# Patient Record
Sex: Female | Born: 1985 | Race: Black or African American | Hispanic: No | Marital: Married | Smoking: Never smoker
Health system: Southern US, Community
[De-identification: ages and names within clinical notes are randomized; demographics above are authoritative.]

## PROBLEM LIST (undated history)

## (undated) DIAGNOSIS — Z9889 Other specified postprocedural states: Secondary | ICD-10-CM

## (undated) DIAGNOSIS — R51 Headache: Secondary | ICD-10-CM

## (undated) DIAGNOSIS — R519 Headache, unspecified: Secondary | ICD-10-CM

## (undated) HISTORY — DX: Headache, unspecified: R51.9

## (undated) HISTORY — DX: Headache: R51

---

## 2015-07-19 ENCOUNTER — Encounter (HOSPITAL_COMMUNITY): Payer: Self-pay | Admitting: Emergency Medicine

## 2015-07-19 ENCOUNTER — Emergency Department (HOSPITAL_COMMUNITY): Payer: No Typology Code available for payment source

## 2015-07-19 ENCOUNTER — Emergency Department (HOSPITAL_COMMUNITY)
Admission: EM | Admit: 2015-07-19 | Discharge: 2015-07-19 | Disposition: A | Discharge status: Home / Self Care | Payer: No Typology Code available for payment source | Attending: Emergency Medicine | Admitting: Emergency Medicine

## 2015-07-19 DIAGNOSIS — M549 Dorsalgia, unspecified: Secondary | ICD-10-CM | POA: Diagnosis not present

## 2015-07-19 DIAGNOSIS — Z3202 Encounter for pregnancy test, result negative: Secondary | ICD-10-CM | POA: Diagnosis not present

## 2015-07-19 DIAGNOSIS — R202 Paresthesia of skin: Secondary | ICD-10-CM | POA: Diagnosis not present

## 2015-07-19 DIAGNOSIS — R200 Anesthesia of skin: Secondary | ICD-10-CM

## 2015-07-19 LAB — CBC
HEMATOCRIT: 41.7 % (ref 36.0–46.0)
HEMOGLOBIN: 13.6 g/dL (ref 12.0–15.0)
MCH: 26.8 pg (ref 26.0–34.0)
MCHC: 32.6 g/dL (ref 30.0–36.0)
MCV: 82.2 fL (ref 78.0–100.0)
Platelets: 294 10*3/uL (ref 150–400)
RBC: 5.07 MIL/uL (ref 3.87–5.11)
RDW: 13.7 % (ref 11.5–15.5)
WBC: 7.5 10*3/uL (ref 4.0–10.5)

## 2015-07-19 LAB — LIPASE, BLOOD: Lipase: 29 U/L (ref 11–51)

## 2015-07-19 LAB — COMPREHENSIVE METABOLIC PANEL
ALBUMIN: 4 g/dL (ref 3.5–5.0)
ALK PHOS: 63 U/L (ref 38–126)
ALT: 12 U/L — ABNORMAL LOW (ref 14–54)
ANION GAP: 11 (ref 5–15)
AST: 14 U/L — AB (ref 15–41)
BUN: 8 mg/dL (ref 6–20)
CO2: 23 mmol/L (ref 22–32)
Calcium: 9.5 mg/dL (ref 8.9–10.3)
Chloride: 105 mmol/L (ref 101–111)
Creatinine, Ser: 0.77 mg/dL (ref 0.44–1.00)
GFR calc Af Amer: >60 mL/min (ref >60)
GFR calc non Af Amer: >60 mL/min (ref >60)
GLUCOSE: 107 mg/dL — AB (ref 65–99)
POTASSIUM: 3.6 mmol/L (ref 3.5–5.1)
SODIUM: 139 mmol/L (ref 135–145)
Total Bilirubin: 0.4 mg/dL (ref 0.3–1.2)
Total Protein: 8 g/dL (ref 6.5–8.1)

## 2015-07-19 LAB — URINALYSIS, ROUTINE W REFLEX MICROSCOPIC
BILIRUBIN URINE: NEGATIVE
GLUCOSE, UA: NEGATIVE mg/dL
HGB URINE DIPSTICK: NEGATIVE
Ketones, ur: NEGATIVE mg/dL
Leukocytes, UA: NEGATIVE
Nitrite: NEGATIVE
PH: 7 (ref 5.0–8.0)
Protein, ur: NEGATIVE mg/dL
SPECIFIC GRAVITY, URINE: 1.007 (ref 1.005–1.030)

## 2015-07-19 LAB — I-STAT BETA HCG BLOOD, ED (MC, WL, AP ONLY): I-stat hCG, quantitative: <5 m[IU]/mL (ref <5)

## 2015-07-19 MED ORDER — LORAZEPAM 2 MG/ML IJ SOLN
1.0000 mg | Freq: Once | INTRAMUSCULAR | Status: AC
  Administered 2015-07-19: 1 mg via INTRAMUSCULAR
  Filled 2015-07-19: qty 1

## 2015-07-19 MED ORDER — SODIUM CHLORIDE 0.9 % IV SOLN: INTRAVENOUS | Status: DC

## 2015-07-19 NOTE — ED Notes (Signed)
Pt states she was claustrophobic and could not complete MRI scan. They attempted three times. Pt brought back to room.

## 2015-07-19 NOTE — ED Notes (Signed)
Pt sts left arm numbness and bilateral leg numbness starting while at work today; no obvious neuro deficits noted at present

## 2015-07-19 NOTE — Discharge Instructions (Signed)
Paresthesia Paresthesia is an abnormal burning or prickling sensation. This sensation is generally felt in the hands, arms, legs, or feet. However, it may occur in any part of the body. Usually, it is not painful. The feeling may be described as:  Tingling or numbness.  Pins and needles.  Skin crawling.  Buzzing.  Limbs falling asleep.  Itching. Most people experience temporary (transient) paresthesia at some time in their lives. Paresthesia may occur when you breathe too quickly (hyperventilation). It can also occur without any apparent cause. Commonly, paresthesia occurs when pressure is placed on a nerve. The sensation quickly goes away after the pressure is removed. For some people, however, paresthesia is a long-lasting (chronic) condition that is caused by an underlying disorder. If you continue to have paresthesia, you may need further medical evaluation. HOME CARE INSTRUCTIONS Watch your condition for any changes. Taking the following actions may help to lessen any discomfort that you are feeling:  Avoid drinking alcohol.  Try acupuncture or massage to help relieve your symptoms.  Keep all follow-up visits as directed by your health care provider. This is important. SEEK MEDICAL CARE IF:  You continue to have episodes of paresthesia.  Your burning or prickling feeling gets worse when you walk.  You have pain, cramps, or dizziness.  You develop a rash. SEEK IMMEDIATE MEDICAL CARE IF:  You feel weak.  You have trouble walking or moving.  You have problems with speech, understanding, or vision.  You feel confused.  You cannot control your bladder or bowel movements.  You have numbness after an injury.  You faint.   This information is not intended to replace advice given to you by your health care provider. Make sure you discuss any questions you have with your health care provider.   Document Released: 07/05/2002 Document Revised: 11/29/2014 Document Reviewed:  07/11/2014 Elsevier Interactive Patient Education 2016 [employer] Inc.  

## 2015-07-19 NOTE — ED Notes (Signed)
Pt A&OX4, ambulatory at d/c with steady gait, NAD 

## 2015-07-19 NOTE — ED Provider Notes (Signed)
CSN: [id number]     Arrival date & time 07/19/15  1656 History   First MD Initiated Contact with Patient 07/19/15 1729     Chief Complaint  Patient presents with  . Numbness     (Consider location/radiation/quality/duration/timing/severity/associated sxs/prior Treatment) HPI Comments: Patient here with acute onset of left arm numbness and paresthesias which began today. Her symptoms have been intermittent. There was no associated headache or blurred vision. No neck pain. States that there was no change in her speech. There is no associated confusion. States that the numbness that went to her left leg and spread to her right leg. Symptoms have been without associated back pain. No prior history of same. Nothing makes them better or worse and no treatment used prior to arrival.  The history is provided by the patient.    History reviewed. No pertinent past medical history. History reviewed. No pertinent past surgical history. History reviewed. No pertinent family history. Social History  Substance Use Topics  . Smoking status: Never Smoker   . Smokeless tobacco: None  . Alcohol Use: Yes     Comment: occ   OB History    No data available     Review of Systems  All other systems reviewed and are negative.     Allergies  Review of patient's allergies indicates no known allergies.  Home Medications   Prior to Admission medications   Medication Sig Start Date End Date Taking? Authorizing Provider  etonogestrel (NEXPLANON) 68 MG IMPL implant 1 each by Subdermal route once.   Yes Historical Provider, MD   BP 104/88 mmHg  Pulse 90  Temp(Src) 98.3 F (36.8 C) (Oral)  Resp 18  SpO2 100%  LMP 07/03/2015 Physical Exam  Constitutional: She is oriented to person, place, and time. She appears well-developed and well-nourished.  Non-toxic appearance. No distress.  HENT:  Head: Normocephalic and atraumatic.  Eyes: Conjunctivae, EOM and lids are normal. Pupils are equal, round,  and reactive to light.  Neck: Normal range of motion. Neck supple. No tracheal deviation present. No thyroid mass present.  Cardiovascular: Normal rate, regular rhythm and normal heart sounds.  Exam reveals no gallop.   No murmur heard. Pulmonary/Chest: Effort normal and breath sounds normal. No stridor. No respiratory distress. She has no decreased breath sounds. She has no wheezes. She has no rhonchi. She has no rales.  Abdominal: Soft. Normal appearance and bowel sounds are normal. She exhibits no distension. There is no tenderness. There is no rebound and no CVA tenderness.  Musculoskeletal: Normal range of motion. She exhibits no edema or tenderness.  Neurological: She is alert and oriented to person, place, and time. She has normal strength. No cranial nerve deficit or sensory deficit. GCS eye subscore is 4. GCS verbal subscore is 5. GCS motor subscore is 6.  Skin: Skin is warm and dry. No abrasion and no rash noted.  Psychiatric: She has a normal mood and affect. Her speech is normal and behavior is normal.  Nursing note and vitals reviewed.   ED Course  Procedures (including critical care time) Labs Review Labs Reviewed  COMPREHENSIVE METABOLIC PANEL - Abnormal; Notable for the following:    Glucose, Bld 107 (*)    AST 14 (*)    ALT 12 (*)    All other components within normal limits  LIPASE, BLOOD  CBC  URINALYSIS, ROUTINE W REFLEX MICROSCOPIC (NOT AT [facility])  I-STAT BETA HCG BLOOD, ED ([facility], [facility], [facility] ONLY)  I-STAT BETA HCG BLOOD, ED (  [facility], [facility], [facility] ONLY)    Imaging Review Mr Brain Wo Contrast  07/19/2015  CLINICAL DATA:  Left arm numbness. Bilateral leg numbness. Symptoms began today at work. EXAM: MRI HEAD WITHOUT CONTRAST TECHNIQUE: Multiplanar, multiecho pulse sequences of the brain and surrounding structures were obtained without intravenous contrast. COMPARISON:  None. FINDINGS: The diffusion-weighted images demonstrate no evidence for acute or subacute infarction. No acute  hemorrhage or mass lesion is present. There is no significant white matter disease. The ventricles are of normal size. No significant extra-axial fluid is present. The internal auditory canals are within normal limits. Flow is present in the major intracranial arteries. The globes and orbits are intact. Skullbase is within normal limits. Midline sagittal images are unremarkable. IMPRESSION: 1. Negative MRI of the brain. Electronically Signed   By: Marin Roberts M.D.   On: 07/19/2015 20:48   I have personally reviewed and evaluated these images and lab results as part of my medical decision-making.   EKG Interpretation   Date/Time:  Wednesday July 19 2015 18:00:27 EST Ventricular Rate:  93 PR Interval:  168 QRS Duration: 83 QT Interval:  341 QTC Calculation: 424 R Axis:   61 Text Interpretation:  Sinus rhythm Confirmed by Freida Busman  MD, Ariely Riddell ([id number])  on 07/19/2015 6:07:25 PM      MDM   Final diagnoses:  Numbness    Patient's neurological exam at time of discharge remains stable. Workup including brain MRI negative. Will be given referral to neurology    Lorre Nick, MD 07/19/15 2133

## 2015-08-02 ENCOUNTER — Encounter: Payer: Self-pay | Admitting: Neurology

## 2015-08-02 ENCOUNTER — Telehealth: Payer: Self-pay | Admitting: *Deleted

## 2015-08-02 ENCOUNTER — Ambulatory Visit (INDEPENDENT_AMBULATORY_CARE_PROVIDER_SITE_OTHER): Payer: No Typology Code available for payment source | Admitting: Neurology

## 2015-08-02 VITALS — BP 116/74 | HR 84 | Ht 64.0 in | Wt 212.8 lb

## 2015-08-02 DIAGNOSIS — R202 Paresthesia of skin: Secondary | ICD-10-CM

## 2015-08-02 DIAGNOSIS — G43909 Migraine, unspecified, not intractable, without status migrainosus: Secondary | ICD-10-CM

## 2015-08-02 DIAGNOSIS — G43009 Migraine without aura, not intractable, without status migrainosus: Secondary | ICD-10-CM | POA: Diagnosis not present

## 2015-08-02 DIAGNOSIS — R200 Anesthesia of skin: Secondary | ICD-10-CM

## 2015-08-02 DIAGNOSIS — G43109 Migraine with aura, not intractable, without status migrainosus: Secondary | ICD-10-CM

## 2015-08-02 DIAGNOSIS — G459 Transient cerebral ischemic attack, unspecified: Secondary | ICD-10-CM

## 2015-08-02 MED ORDER — TOPIRAMATE 50 MG PO TABS: 50.0000 mg | ORAL_TABLET | Freq: Every day | ORAL | Status: DC

## 2015-08-02 NOTE — Telephone Encounter (Signed)
Pt [facility] and [facility](office administrator) c/o CP. Called pt back and she stated she has had the CP since she left office. She has had this happen before in the hospital and they performed EKG which came back normal. Asked if she has anxiety and she stated they just started ativan last week for anxiety. She just took a pill. Advised if this does not help, and CP does not improve/gets worse, she needs to proceed to ER to be evaluated. She verbalized understanding.

## 2015-08-02 NOTE — Patient Instructions (Signed)
Overall you are doing fairly well but I do want to suggest a few things today:   Remember to drink plenty of fluid, eat healthy meals and do not skip any meals. Try to eat protein with a every meal and eat a healthy snack such as fruit or nuts in between meals. Try to keep a regular sleep-wake schedule and try to exercise daily, particularly in the form of walking, 20-30 minutes a day, if you can.   As far as your medications are concerned, I would like to suggest: Topiramate 25mg (1/2 pill) for one week then increase to a whole pill at night. Stop daily analgesics.   As far as diagnostic testing: Labs and EEG  I would like to see you back in 3 months, sooner if we need to. Please call us with any interim questions, concerns, problems, updates or refill requests.   Our phone number is [phone]. We also have an after hours call service for urgent matters and there is a physician on-call for urgent questions. For any emergencies you know to call 911 or go to the nearest emergency room

## 2015-08-02 NOTE — Progress Notes (Signed)
[facility]    Provider:  Dr Lucia GaskinsAhern Referring Provider: No ref. provider found Primary Care Physician:  No primary care provider on file.  CC:  Left arm and left leg numbness  HPI:  Abigail Briggs is a 30 y.o. female here as a referral from the ED for episodic left arm and leg numbness. PMHx includes migraines and obesity. She was standing up at work. 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 The left arm became numb and then her leg started getting numb and she felt dizzy like she was going to pass out. No inciting events. She felt like the room was spinning, felt like she was going to drop. It lasted for 30-45 minutes. They called the paramedics and it was still numb and then worsened, she couldn't breathe, she was shaking, she was scared. Symptoms started to calm down again when she got to the ED. She has a history of migraines. She has never had this happen before. She had no weakness. She was able to walk to the car. It was her whole arm and the bottom half of her leg that were numb. The symptoms resolved but they are still happening intermittently. She is feeling it on a daily basis. She is having headaches with the symptoms now. She did feel pulsating on the right side of the head when she was in the hospital and in the back right side of the head with the first episode. Now she has a headache on the back of the right side of the head. No light sensitivity or sound sensitivity. She feels her heart fluttering sometimes. No diabetes, never smoked. Denies vision changes, aphasia, dysphagia, facial droop. She has headaches every day, she is sensitive to noise, she tries to close her eyes and lay down with the headaches, pounding and throbbing unilateral. Her whole head can get to throbbing. She is under a lot of stress. No other focal neurologic symptoms.   Reviewed notes, labs and imaging from outside physicians, which showed: MRi of the brain 07/19/2015: personally reviewed  images:and agree with the following: The diffusion-weighted images demonstrate no evidence for acute or subacute infarction. No acute hemorrhage or mass lesion is present. There is no significant white matter disease. The ventricles are of normal size. No significant extra-axial fluid is present.  The internal auditory canals are within normal limits. Flow is present in the major intracranial arteries. The globes and orbits are intact.  Skullbase is within normal limits. Midline sagittal images are unremarkable.  IMPRESSION: 1. Negative MRI of the brain.  CBC, CMP unremarkable  Patient was seen in the ED on 12/21. acute onset of left arm numbness and paresthesias. EKG was unremarkable, exam was normal, MRI of the brain was negative. She was discharged with neurology follow up.  Review of Systems: Patient complains of symptoms per HPI as well as the following symptoms: no CP, no SOB. Pertinent negatives per HPI. All others negative.   Social History   Social History  . Marital Status: Married    Spouse Name: N/A  . Number of Children: 5  . Years of Education: 14   Occupational History  . [employer]     Social History Main Topics  . Smoking status: Never Smoker   . Smokeless tobacco: Not on file  . Alcohol Use: Yes     Comment: occ  . Drug Use: No  . Sexual Activity: Not on file   Other Topics Concern  . Not on  file   Social History Narrative   Lives with husband and children   Caffeine use: 32 oz per day    Family History  Problem Relation Age of Onset  . Migraines Neg Hx   . Stroke Neg Hx     Past Medical History  Diagnosis Date  . Headache     Past Surgical History  Procedure Laterality Date  . Cesarean section  2014    Current Outpatient Prescriptions  Medication Sig Dispense Refill  . LORazepam (ATIVAN) 1 MG tablet Take 0.5 mg by mouth.    . methylPREDNISolone (MEDROL DOSEPAK) 4 MG TBPK tablet follow package directions    . etonogestrel  (NEXPLANON) 68 MG IMPL implant 1 each by Subdermal route once.    . topiramate (TOPAMAX) 50 MG tablet Take 1 tablet (50 mg total) by mouth at bedtime. 30 tablet 11   No current facility-administered medications for this visit.    Allergies as of 08/02/2015  . (No Known Allergies)    Vitals: BP 116/74 mmHg  Pulse 84  Ht 5\' 4"  (1.626 m)  Wt 212 lb 12.8 oz (96.525 kg)  BMI 36.51 kg/m2  LMP 07/03/2015 Last Weight:  Wt Readings from Last 1 Encounters:  08/02/15 212 lb 12.8 oz (96.525 kg)   Last Height:   Ht Readings from Last 1 Encounters:  08/02/15 5\' 4"  (1.626 m)   Physical exam: Exam: Gen: NAD, conversant, well nourised, obese, well groomed                     CV: RRR, no MRG. No Carotid Bruits. No peripheral edema, warm, nontender Eyes: Conjunctivae clear without exudates or hemorrhage  Neuro: Detailed Neurologic Exam  Speech:    Speech is normal; fluent and spontaneous with normal comprehension.  Cognition:    The patient is oriented to person, place, and time;     recent and remote memory intact;     language fluent;     normal attention, concentration,     fund of knowledge Cranial Nerves:    The pupils are equal, round, and reactive to light. The fundi are normal and spontaneous venous pulsations are present. Visual fields are full to finger confrontation. Extraocular movements are intact. Trigeminal sensation is intact and the muscles of mastication are normal. The face is symmetric. The palate elevates in the midline. Hearing intact. Voice is normal. Shoulder shrug is normal. The tongue has normal motion without fasciculations.   Coordination:    Normal finger to nose and heel to shin. Normal rapid alternating movements.   Gait:    Heel-toe and tandem gait are normal.   Motor Observation:    No asymmetry, no atrophy, and no involuntary movements noted. Tone:    Normal muscle tone.    Posture:    Posture is normal. normal erect    Strength:    Strength  is V/V in the upper and lower limbs.      Sensation: intact to LT     Reflex Exam:  DTR's:    Deep tendon reflexes in the upper and lower extremities are normal bilaterally.   Toes:    The toes are downgoing bilaterally.   Clonus:    Clonus is absent.       Assessment/Plan:  30 year old obese female with acute onset left arm and leg numbness, with repeated episodes. PMHx includes migraines and obesity. Differential includes sensoty seizure vs complicated migraine or panic attack, less likely TIA  Episodes  of left-sided numbness: EEG, will check for hypercoagulable disorders Migraines: Start Topamax.  If unrevealing, can order an emg/ncs. Thanks.  To prevent or relieve headaches, try the following: Cool Compress. Lie down and place a cool compress on your head.  Avoid headache triggers. If certain foods or odors seem to have triggered your migraines in the past, avoid them. A headache diary might help you identify triggers.  Include physical activity in your daily routine. Try a daily walk or other moderate aerobic exercise.  Manage stress. Find healthy ways to cope with the stressors, such as delegating tasks on your to-do list.  Practice relaxation techniques. Try deep breathing, yoga, massage and visualization.  Eat regularly. Eating regularly scheduled meals and maintaining a healthy diet might help prevent headaches. Also, drink plenty of fluids.  Follow a regular sleep schedule. Sleep deprivation might contribute to headaches Consider biofeedback. With this mind-body technique, you learn to control certain bodily functions - such as muscle tension, heart rate and blood pressure - to prevent headaches or reduce headache pain.    Proceed to emergency room if you experience new or worsening symptoms or symptoms do not resolve, if you have new neurologic symptoms or if headache is severe, or for any concerning symptom.    Discussed side effects of Topiramate. Serious side  effects can inclue: Abdominal or stomach pain  fever, chills, or sore throat  lessening of sensations or perception  loss of appetite  mood or mental changes, including aggression, agitation, apathy, irritability, and mental depression  red, irritated, or bleeding gums  weight loss Rare  Blood in the urine  decrease in sexual performance or desire  difficult or painful urination  frequent urination  hearing loss  loss of bladder control  lower back or side pain  nosebleeds  pale skin  red or irritated eyes  ringing or buzzing in the ears  skin rash or itching  swelling  trouble breathing Common side effects of Topamax include:  tiredness,  drowsiness,  dizziness,  nervousness,  numbness or tingly feeling,  coordination problems,  diarrhea,  weight loss,  speech/language problems,  changes in vision,  sensory distortion,  loss of appetite,  bad taste in your mouth,  confusion,  slowed thinking,  trouble concentrating or paying attention,  memory problems,       Naomie Dean, MD  [facility] [address] [place], [place] [address]  Phone [phone] Fax [phone]

## 2015-08-04 NOTE — Telephone Encounter (Signed)
I spoke to pt.  She had this reaction after 2nd dose of topamax for headaches.  EMT checked her out and they thought topamax.  I told her to stop the topamax and be evaluated by pcp, which she stated has appt today.  She will let us know how she is on Monday and then go from there.   I would let Dr. Lucia GaskinsAhern know.   She verbalized understanding.

## 2015-08-04 NOTE — Telephone Encounter (Signed)
Patient called to advise, "this morning heart took like a long flutter, chest got warm and hot like she could barely breathe, heart started racing really fast, patient called ambulance, EMT said it may be a side effect of topiramate (TOPAMAX) 50 MG tablet.".

## 2015-08-07 ENCOUNTER — Ambulatory Visit: Payer: No Typology Code available for payment source | Admitting: Neurology

## 2015-08-07 NOTE — Telephone Encounter (Signed)
Sounds like an uncommon side effects for topamax. We can try a different medication if she likes, thanks

## 2015-08-08 ENCOUNTER — Other Ambulatory Visit: Payer: Self-pay | Admitting: Neurology

## 2015-08-08 MED ORDER — NORTRIPTYLINE HCL 10 MG PO CAPS: 10.0000 mg | ORAL_CAPSULE | Freq: Every day | ORAL | Status: DC

## 2015-08-08 NOTE — Telephone Encounter (Signed)
Called pt back. She stated she is off of topamax. She would like to try a different medication. She was told by EMT/PCP this was most likely topamax. Advised per Dr Lucia GaskinsAhern this is an uncommon SE of medication. She is happy to try a different medication. Advised I will call her back to advise once I speak to Dr Lucia GaskinsAhern. She verbalized understanding. She went to PCP and she referred her to cardiology to further evaluate heart palpitations.

## 2015-08-08 NOTE — Telephone Encounter (Signed)
Spoke with patient, we can start Nortriptyline. She takes it at night before bed. It may make her tired. side effects include: teratogenicity, do not Pregnant on this medication and use birth control. Discussed side effects. Reviewed EKG, QTc wnl. Serious side effects can include hypotension, hypertension, syncope, ventricular arrhythmias, QT prolongation and other cardiac side effects, stroke and seizures, ataxia tardive dyskinesias, extrapyramidal symptoms, increased intraocular pressure, leukopenia, thrombocytopenia, hallucinations, suicidality and other serious side effects. Common reactions include drowsiness, dry mouth, dizziness, constipation, blurred vision, palpitations, tachycardia, impaired coordination, increased appetite, nausea vomiting, weakness, confusion, disorientation, restlessness, anxiety and other side effects.

## 2015-08-10 ENCOUNTER — Encounter: Payer: Self-pay | Admitting: Neurology

## 2015-08-10 ENCOUNTER — Telehealth: Payer: Self-pay | Admitting: Neurology

## 2015-08-10 LAB — HYPERCOAGULABLE PANEL, COMPREHENSIVE
ANTICARDIOLIPIN AB, IGM: 19 [MPL'U]
APTT: 24.3 s
AT III Act/Nor PPP Chro: 126 %
Act. Prt C Resist w/FV Defic.: 2.6 ratio
Beta-2 Glycoprotein I, IgA: <10 SAU
DRVVT Screen Seconds: 44 s
FACTOR VII ANTIGEN: 73 %
FACTOR VIII ACTIVITY: 164 % — AB
HEXAGONAL PHOSPHOLIPID NEUTRAL: 1 s
HOMOCYSTEINE: 13 umol/L
PROT C AG ACT/NOR PPP IMM: 102 %
PROT S AG ACT/NOR PPP IMM: 106 %
Protein C Ag/FVII Ag Ratio**: 1.4 ratio
Protein S Ag/FVII Ag Ratio**: 1.5 ratio

## 2015-08-10 LAB — HEMOGLOBINOPATHY EVALUATION
HEMOGLOBIN A2 QUANTITATION: 2.3 % (ref 0.7–3.1)
HEMOGLOBIN F QUANTITATION: 0 % (ref 0.0–2.0)
HGB C: 0 %
HGB S: 0 %
Hgb A: 97.7 % (ref 94.0–98.0)

## 2015-08-10 NOTE — Telephone Encounter (Signed)
Abigail Briggs, would you call patient and see what question she has about her medication? She was on trokendi and had a reaction then I switched her to Nortriptyline. She says she has a question. thanks

## 2015-08-11 NOTE — Telephone Encounter (Signed)
Called pt. Advised Dr Lucia GaskinsAhern cannot do short-term disability as requested. She needs to go to PCP. She verbalized understanding.

## 2015-08-11 NOTE — Telephone Encounter (Signed)
I won't do short-term disability, her primary care needs to thanks

## 2015-08-11 NOTE — Telephone Encounter (Signed)
Called pt. She wanted to know if she should take nortriptyline every night. I advised per Dr Lucia GaskinsAhern instructions that she should take medication every night. She also wanted to know if Dr Lucia GaskinsAhern will do short-term leave for her from work (2-3 weeks). Advised I will have to speak to Dr Lucia GaskinsAhern first and I will call her back to let her know later on today. She verbalized understanding.

## 2015-08-30 ENCOUNTER — Ambulatory Visit (INDEPENDENT_AMBULATORY_CARE_PROVIDER_SITE_OTHER): Payer: No Typology Code available for payment source | Admitting: Neurology

## 2015-08-30 DIAGNOSIS — G459 Transient cerebral ischemic attack, unspecified: Secondary | ICD-10-CM

## 2015-08-30 DIAGNOSIS — R55 Syncope and collapse: Secondary | ICD-10-CM | POA: Diagnosis not present

## 2015-08-30 DIAGNOSIS — R202 Paresthesia of skin: Principal | ICD-10-CM

## 2015-08-30 DIAGNOSIS — R200 Anesthesia of skin: Secondary | ICD-10-CM

## 2015-08-30 DIAGNOSIS — G43109 Migraine with aura, not intractable, without status migrainosus: Secondary | ICD-10-CM

## 2015-08-30 DIAGNOSIS — G43009 Migraine without aura, not intractable, without status migrainosus: Secondary | ICD-10-CM

## 2015-08-30 NOTE — Procedures (Signed)
    History:  Abigail Briggs is a 30 year old patient with a history of obesity who had an episode of dizziness and left-sided numbness that began on 07/19/2015. The episode lasted 30-45 minutes. The patient is being evaluated for this event. MRI of the brain was relatively unremarkable.  This is a routine EEG. No skull defects are noted. Medications include Nexplanon and nortriptyline.   EEG classification: Normal awake  Description of the recording: The background rhythms of this recording consists of a fairly well modulated medium amplitude alpha rhythm of 10 Hz that is reactive to eye opening and closure. As the record progresses, the patient appears to remain in the waking state throughout the recording. Photic stimulation was performed, resulting in a bilateral and symmetric photic driving response. Hyperventilation was also performed, resulting in a minimal buildup of the background rhythm activities without significant slowing seen. At no time during the recording does there appear to be evidence of spike or spike wave discharges or evidence of focal slowing. EKG monitor shows no evidence of cardiac rhythm abnormalities with a heart rate of 84.  Impression: This is a normal EEG recording in the waking state. No evidence of ictal or interictal discharges are seen.

## 2015-08-31 ENCOUNTER — Telehealth: Payer: Self-pay | Admitting: *Deleted

## 2015-08-31 NOTE — Telephone Encounter (Signed)
-----   Message from Anson Fret, MD sent at 08/30/2015  5:34 PM EST ----- EEG was negative thanks

## 2015-08-31 NOTE — Telephone Encounter (Signed)
Patient returned Emma's call, advised EEG negative.

## 2015-08-31 NOTE — Telephone Encounter (Addendum)
LVM for pt to call about results. Gave [facility] phone number. Ok to inform pt EEG negative.

## 2015-09-06 ENCOUNTER — Telehealth: Payer: Self-pay | Admitting: Neurology

## 2015-09-06 NOTE — Telephone Encounter (Signed)
Per Dr Lucia Gaskins- okay to schedule f/u w/ pt.

## 2015-09-06 NOTE — Telephone Encounter (Signed)
Pt called and said that she was seen in the ER in [place] for her problems with her head and needs to speak to the nurse about getting in to be seen for a f/u. I did not see any notes from the hospital in pt's chart not sure if she was seen at a [place] hospital.

## 2015-09-12 ENCOUNTER — Ambulatory Visit (INDEPENDENT_AMBULATORY_CARE_PROVIDER_SITE_OTHER): Payer: No Typology Code available for payment source | Admitting: Neurology

## 2015-09-12 ENCOUNTER — Encounter: Payer: Self-pay | Admitting: Neurology

## 2015-09-12 VITALS — BP 128/80 | HR 77 | Ht 64.0 in | Wt 215.2 lb

## 2015-09-12 DIAGNOSIS — G43001 Migraine without aura, not intractable, with status migrainosus: Secondary | ICD-10-CM | POA: Diagnosis not present

## 2015-09-12 MED ORDER — SUMATRIPTAN SUCCINATE 100 MG PO TABS: 100.0000 mg | ORAL_TABLET | Freq: Once | ORAL | Status: DC | PRN

## 2015-09-12 MED ORDER — DICLOFENAC POTASSIUM(MIGRAINE) 50 MG PO PACK: 50.0000 mg | PACK | Freq: Once | ORAL | Status: DC | PRN

## 2015-09-12 MED ORDER — ONDANSETRON 4 MG PO TBDP: 4.0000 mg | ORAL_TABLET | Freq: Three times a day (TID) | ORAL | Status: DC | PRN

## 2015-09-12 NOTE — Patient Instructions (Signed)
Remember to drink plenty of fluid, eat healthy meals and do not skip any meals. Try to eat protein with a every meal and eat a healthy snack such as fruit or nuts in between meals. Try to keep a regular sleep-wake schedule and try to exercise daily, particularly in the form of walking, 20-30 minutes a day, if you can.   As far as your medications are concerned, I would like to suggest: Please take one tablet Imitrex and zofran at the onset of your headache. If it does not improve the symptoms please take one additional imitrex tablet tablet in 2 hours. Do not take more then 2 tablets in 24hrs. Do not take use more then 2 to 3 times in a week.Can also try the Cambia once at onset of headache.  Our phone number is [phone]. We also have an after hours call service for urgent matters and there is a physician on-call for urgent questions. For any emergencies you know to call 911 or go to the nearest emergency room

## 2015-09-12 NOTE — Progress Notes (Signed)
[facility]    Provider:  Dr Lucia Gaskins Referring Provider: No ref. provider found Primary Care Physician:  No primary care provider on file.  CC: Migraines  Interval history 09/12/2015:  She was started on Topirmate for migraines but had reaction after one dose. She was started on Nortriptyline instead. Headaches for years. She was at [facility] a week ago for headache and no headaches since. She has had one bad headache this month and a few small ones. This is improved from last visit where she reported daily headaches. Headaches are unilateral, throbbing, pounding with light and sound sensitivity, she tries to lay down, no Family History of migraines. No side effects from the nortriptyline. She was seen in the ED for a severe migraine over 2 days. She has no medication overuse headache, she has only use advil once this month. We will continue the nortriptyline.   HPI: Abigail Briggs is a 30 y.o. female here as a referral from the ED for episodic left arm and leg numbness. PMHx includes migraines and obesity. She was standing up at work. 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 The left arm became numb and then her leg started getting numb and she felt dizzy like she was going to pass out. No inciting events. She felt like the room was spinning, felt like she was going to drop. It lasted for 30-45 minutes. They called the paramedics and it was still numb and then worsened, she couldn't breathe, she was shaking, she was scared. Symptoms started to calm down again when she got to the ED. She has a history of migraines. She has never had this happen before. She had no weakness. She was able to walk to the car. It was her whole arm and the bottom half of her leg that were numb. The symptoms resolved but they are still happening intermittently. She is feeling it on a daily basis. She is having headaches with the symptoms now. She did feel pulsating on the right side of the head when she  was in the hospital and in the back right side of the head with the first episode. Now she has a headache on the back of the right side of the head. No light sensitivity or sound sensitivity. She feels her heart fluttering sometimes. No diabetes, never smoked. Denies vision changes, aphasia, dysphagia, facial droop. She has headaches every day, she is sensitive to noise, she tries to close her eyes and lay down with the headaches, pounding and throbbing unilateral. Her whole head can get to throbbing. She is under a lot of stress. No other focal neurologic symptoms.   Reviewed notes, labs and imaging from outside physicians, which showed: MRi of the brain 07/19/2015: personally reviewed images:and agree with the following: The diffusion-weighted images demonstrate no evidence for acute or subacute infarction. No acute hemorrhage or mass lesion is present. There is no significant white matter disease. The ventricles are of normal size. No significant extra-axial fluid is present.  The internal auditory canals are within normal limits. Flow is present in the major intracranial arteries. The globes and orbits are intact.  Skullbase is within normal limits. Midline sagittal images are unremarkable.  IMPRESSION: 1. Negative MRI of the brain.  CBC, CMP unremarkable  Patient was seen in the ED on 12/21. acute onset of left arm numbness and paresthesias. EKG was unremarkable, exam was normal, MRI of the brain was negative. She was discharged with neurology follow up.  Social History  Social History  . Marital Status: Married    Spouse Name: N/A  . Number of Children: 5  . Years of Education: 14   Occupational History  . [employer]     Social History Main Topics  . Smoking status: Never Smoker   . Smokeless tobacco: Not on file  . Alcohol Use: Yes     Comment: occ  . Drug Use: No  . Sexual Activity: Not on file   Other Topics Concern  . Not on file   Social History Narrative    Lives with husband and children   Caffeine use: 32 oz per day    Family History  Problem Relation Age of Onset  . Migraines Neg Hx   . Stroke Neg Hx     Past Medical History  Diagnosis Date  . Headache     Past Surgical History  Procedure Laterality Date  . Cesarean section  2014    Current Outpatient Prescriptions  Medication Sig Dispense Refill  . etonogestrel (NEXPLANON) 68 MG IMPL implant 1 each by Subdermal route once.    . methylPREDNISolone (MEDROL DOSEPAK) 4 MG TBPK tablet follow package directions    . nortriptyline (PAMELOR) 10 MG capsule Take 1 capsule (10 mg total) by mouth at bedtime. 30 capsule 11   No current facility-administered medications for this visit.    Allergies as of 09/12/2015  . (No Known Allergies)    Vitals: There were no vitals taken for this visit. Last Weight:  Wt Readings from Last 1 Encounters:  08/02/15 212 lb 12.8 oz (96.525 kg)   Last Height:   Ht Readings from Last 1 Encounters:  08/02/15  (1.626 m)    Speech:  Speech is normal; fluent and spontaneous with normal comprehension.  Cognition:  The patient is oriented to person, place, and time;   recent and remote memory intact;   language fluent;   normal attention, concentration,   fund of knowledge Cranial Nerves:  The pupils are equal, round, and reactive to light. The fundi are normal and spontaneous venous pulsations are present. Visual fields are full to finger confrontation. Extraocular movements are intact. Trigeminal sensation is intact and the muscles of mastication are normal. The face is symmetric. The palate elevates in the midline. Hearing intact. Voice is normal. Shoulder shrug is normal. The tongue has normal motion without fasciculations.   Coordination:  Normal finger to nose and heel to shin. Normal rapid alternating movements.   Gait:  Heel-toe and tandem gait are normal.   Motor Observation:  No asymmetry, no atrophy,  and no involuntary movements noted. Tone:  Normal muscle tone.   Posture:  Posture is normal. normal erect   Strength:  Strength is V/V in the upper and lower limbs.    Sensation: intact to LT   Reflex Exam:  DTR's:  Deep tendon reflexes in the upper and lower extremities are normal bilaterally.  Toes:  The toes are downgoing bilaterally.  Clonus:  Clonus is absent.      Assessment/Plan: 30 year old obese female with migraines:  Episodes of left-sided numbness: EEG, will check for hypercoagulable disorders which were negative, MRi was negative 06/2015 Migraines: Topamax with side effects. Doing well on Nortriptyline. Imitrex and zofran for acue management. The most common side-effects are feeling sick (nausea), dizziness and dry mouth. In addition, triptans can also cause some people to experience strange sensations. These may be a tightness, tingling, flushing, and feelings of heaviness or pressure in areas  such as the face and limbs, and occasionally the chest. Serious side effects can include stroke, cardiac side effects such as chest tightness, shortness of breath and possible cardiovascular adverse effects.   Zofran: Common side effects of Zofran include: diarrhea, headache, fever, lightheadedness, dizziness, weakness, tiredness, drowsiness. Call us at once if you have a serious side effect such ZO:XWRUEAV vision or temporary vision loss (lasting from only a few minutes to several hours); severe dizziness, feeling short of breath, fainting, fast or pounding heartbeats; slow heart rate, trouble breathing; anxiety, agitation, shivering; feeling like you might pass out; or urinating less than usual or not at all.  Cambia: take with food. Stop for GI upset or dark stools.     If unrevealing, can order an emg/ncs. Thanks.  To prevent or relieve headaches, try the following:  Cool Compress. Lie down and place a cool compress on your head.   Avoid  headache triggers. If certain foods or odors seem to have triggered your migraines in the past, avoid them. A headache diary might help you identify triggers.   Include physical activity in your daily routine. Try a daily walk or other moderate aerobic exercise.   Manage stress. Find healthy ways to cope with the stressors, such as delegating tasks on your to-do list.   Practice relaxation techniques. Try deep breathing, yoga, massage and visualization.   Eat regularly. Eating regularly scheduled meals and maintaining a healthy diet might help prevent headaches. Also, drink plenty of fluids.   Follow a regular sleep schedule. Sleep deprivation might contribute to headaches  Consider biofeedback. With this mind-body technique, you learn to control certain bodily functions - such as muscle tension, heart rate and blood pressure - to prevent headaches or reduce headache pain.   Proceed to emergency room if you experience new or worsening symptoms or symptoms do not resolve, if you have new neurologic symptoms or if headache is severe, or for any concerning symptom.   Naomie Dean, MD  [facility] [address] [place], [place] [address]  Phone [phone] Fax [phone]  A total of 30 minutes was spent face-to-face with this patient. Over half this time was spent on counseling patient on the migraine diagnosis and different diagnostic and therapeutic options available.

## 2015-10-30 ENCOUNTER — Ambulatory Visit: Payer: No Typology Code available for payment source | Admitting: Neurology

## 2018-09-05 ENCOUNTER — Encounter (HOSPITAL_COMMUNITY)
Admission: EM | Disposition: A | Discharge status: Home / Self Care | Payer: Self-pay | Source: Home / Self Care | Attending: Obstetrics and Gynecology

## 2018-09-05 ENCOUNTER — Emergency Department (HOSPITAL_COMMUNITY): Payer: Medicaid Other | Admitting: Anesthesiology

## 2018-09-05 ENCOUNTER — Inpatient Hospital Stay (HOSPITAL_COMMUNITY)
Admission: EM | Admit: 2018-09-05 | Discharge: 2018-09-09 | DRG: 769 | Disposition: A | Discharge status: Home / Self Care | Payer: Medicaid Other | Attending: Obstetrics and Gynecology | Admitting: Obstetrics and Gynecology

## 2018-09-05 ENCOUNTER — Encounter (HOSPITAL_COMMUNITY): Payer: Self-pay | Admitting: Emergency Medicine

## 2018-09-05 ENCOUNTER — Inpatient Hospital Stay (HOSPITAL_COMMUNITY): Payer: Medicaid Other

## 2018-09-05 DIAGNOSIS — Z9889 Other specified postprocedural states: Secondary | ICD-10-CM

## 2018-09-05 DIAGNOSIS — O8612 Endometritis following delivery: Secondary | ICD-10-CM | POA: Diagnosis present

## 2018-09-05 DIAGNOSIS — D62 Acute posthemorrhagic anemia: Secondary | ICD-10-CM | POA: Diagnosis present

## 2018-09-05 DIAGNOSIS — O9081 Anemia of the puerperium: Secondary | ICD-10-CM | POA: Diagnosis present

## 2018-09-05 DIAGNOSIS — N939 Abnormal uterine and vaginal bleeding, unspecified: Secondary | ICD-10-CM | POA: Diagnosis not present

## 2018-09-05 DIAGNOSIS — Z419 Encounter for procedure for purposes other than remedying health state, unspecified: Secondary | ICD-10-CM

## 2018-09-05 DIAGNOSIS — Z90711 Acquired absence of uterus with remaining cervical stump: Secondary | ICD-10-CM | POA: Diagnosis present

## 2018-09-05 DIAGNOSIS — Z90710 Acquired absence of both cervix and uterus: Secondary | ICD-10-CM

## 2018-09-05 DIAGNOSIS — Z98891 History of uterine scar from previous surgery: Secondary | ICD-10-CM | POA: Diagnosis not present

## 2018-09-05 DIAGNOSIS — J9600 Acute respiratory failure, unspecified whether with hypoxia or hypercapnia: Secondary | ICD-10-CM

## 2018-09-05 DIAGNOSIS — D649 Anemia, unspecified: Secondary | ICD-10-CM | POA: Diagnosis not present

## 2018-09-05 HISTORY — DX: Other specified postprocedural states: Z98.890

## 2018-09-05 HISTORY — PX: ABDOMINAL HYSTERECTOMY: SHX81

## 2018-09-05 HISTORY — PX: CYSTOSCOPY: SHX5120

## 2018-09-05 HISTORY — PX: BILATERAL SALPINGECTOMY: SHX5743

## 2018-09-05 HISTORY — PX: DILATION AND CURETTAGE OF UTERUS: SHX78

## 2018-09-05 HISTORY — PX: SUPRACERVICAL ABDOMINAL HYSTERECTOMY: SHX5393

## 2018-09-05 LAB — COMPREHENSIVE METABOLIC PANEL
ALT: 10 U/L (ref 0–44)
AST: 13 U/L — ABNORMAL LOW (ref 15–41)
Albumin: 3 g/dL — ABNORMAL LOW (ref 3.5–5.0)
Alkaline Phosphatase: 48 U/L (ref 38–126)
Anion gap: 12 (ref 5–15)
BUN: 5 mg/dL — ABNORMAL LOW (ref 6–20)
CO2: 22 mmol/L (ref 22–32)
Calcium: 8.7 mg/dL — ABNORMAL LOW (ref 8.9–10.3)
Chloride: 107 mmol/L (ref 98–111)
Creatinine, Ser: 0.89 mg/dL (ref 0.44–1.00)
GFR calc Af Amer: >60 mL/min (ref >60)
GFR calc non Af Amer: >60 mL/min (ref >60)
Glucose, Bld: 140 mg/dL — ABNORMAL HIGH (ref 70–99)
Potassium: 3.7 mmol/L (ref 3.5–5.1)
Sodium: 141 mmol/L (ref 135–145)
Total Bilirubin: 0.4 mg/dL (ref 0.3–1.2)
Total Protein: 6.3 g/dL — ABNORMAL LOW (ref 6.5–8.1)

## 2018-09-05 LAB — CBC
HCT: 40.9 % (ref 36.0–46.0)
HCT: 35.9 % — ABNORMAL LOW (ref 36.0–46.0)
HEMOGLOBIN: 12.2 g/dL (ref 12.0–15.0)
Hemoglobin: 13.3 g/dL (ref 12.0–15.0)
MCH: 29.2 pg (ref 26.0–34.0)
MCH: 29.8 pg (ref 26.0–34.0)
MCHC: 32.5 g/dL (ref 30.0–36.0)
MCHC: 34 g/dL (ref 30.0–36.0)
MCV: 89.9 fL (ref 80.0–100.0)
MCV: 87.6 fL (ref 80.0–100.0)
PLATELETS: 131 10*3/uL — AB (ref 150–400)
Platelets: 114 10*3/uL — ABNORMAL LOW (ref 150–400)
RBC: 4.55 MIL/uL (ref 3.87–5.11)
RBC: 4.1 MIL/uL (ref 3.87–5.11)
RDW: 14.6 % (ref 11.5–15.5)
RDW: 14.5 % (ref 11.5–15.5)
WBC: 19.6 10*3/uL — ABNORMAL HIGH (ref 4.0–10.5)
WBC: 17.1 10*3/uL — ABNORMAL HIGH (ref 4.0–10.5)
nRBC: 0 % (ref 0.0–0.2)
nRBC: 0 % (ref 0.0–0.2)

## 2018-09-05 LAB — BASIC METABOLIC PANEL
Anion gap: 8 (ref 5–15)
BUN: 8 mg/dL (ref 6–20)
CHLORIDE: 111 mmol/L (ref 98–111)
CO2: 21 mmol/L — ABNORMAL LOW (ref 22–32)
Calcium: 6.8 mg/dL — ABNORMAL LOW (ref 8.9–10.3)
Creatinine, Ser: 0.77 mg/dL (ref 0.44–1.00)
GFR calc non Af Amer: >60 mL/min (ref >60)
Glucose, Bld: 141 mg/dL — ABNORMAL HIGH (ref 70–99)
Potassium: 4.1 mmol/L (ref 3.5–5.1)
SODIUM: 140 mmol/L (ref 135–145)

## 2018-09-05 LAB — BPAM PLATELET PHERESIS
Blood Product Expiration Date: 202002092359
Blood Product Expiration Date: 202002102359
ISSUE DATE / TIME: 202002081204
ISSUE DATE / TIME: 202002081204
Unit Type and Rh: 7300
Unit Type and Rh: 8400

## 2018-09-05 LAB — PREPARE CRYOPRECIPITATE: Unit division: 0

## 2018-09-05 LAB — PROTIME-INR
INR: 1.33
PROTHROMBIN TIME: 16.3 s — AB (ref 11.4–15.2)

## 2018-09-05 LAB — DIC (DISSEMINATED INTRAVASCULAR COAGULATION)PANEL
D-Dimer, Quant: 2.24 ug/mL-FEU — ABNORMAL HIGH (ref 0.00–0.50)
INR: 1.25
Platelets: 456 10*3/uL — ABNORMAL HIGH (ref 150–400)
Prothrombin Time: 15.6 seconds — ABNORMAL HIGH (ref 11.4–15.2)
Smear Review: NONE SEEN
aPTT: 22 seconds — ABNORMAL LOW (ref 24–36)

## 2018-09-05 LAB — POCT I-STAT 7, (LYTES, BLD GAS, ICA,H+H)
Acid-base deficit: 2 mmol/L (ref 0.0–2.0)
Acid-base deficit: 3 mmol/L — ABNORMAL HIGH (ref 0.0–2.0)
Bicarbonate: 23.3 mmol/L (ref 20.0–28.0)
Bicarbonate: 22.9 mmol/L (ref 20.0–28.0)
CALCIUM ION: 0.84 mmol/L — AB (ref 1.15–1.40)
Calcium, Ion: 0.7 mmol/L — CL (ref 1.15–1.40)
HCT: 21 % — ABNORMAL LOW (ref 36.0–46.0)
HCT: 28 % — ABNORMAL LOW (ref 36.0–46.0)
Hemoglobin: 7.1 g/dL — ABNORMAL LOW (ref 12.0–15.0)
Hemoglobin: 9.5 g/dL — ABNORMAL LOW (ref 12.0–15.0)
O2 Saturation: 100 %
O2 Saturation: 100 %
PCO2 ART: 42.8 mmHg (ref 32.0–48.0)
PH ART: 7.372 (ref 7.350–7.450)
POTASSIUM: 3.5 mmol/L (ref 3.5–5.1)
Patient temperature: 35.7
Patient temperature: 35.5
Potassium: 4 mmol/L (ref 3.5–5.1)
Sodium: 142 mmol/L (ref 135–145)
Sodium: 143 mmol/L (ref 135–145)
TCO2: 25 mmol/L (ref 22–32)
TCO2: 24 mmol/L (ref 22–32)
pCO2 arterial: 39.6 mmHg (ref 32.0–48.0)
pH, Arterial: 7.329 — ABNORMAL LOW (ref 7.350–7.450)
pO2, Arterial: 500 mmHg — ABNORMAL HIGH (ref 83.0–108.0)
pO2, Arterial: 462 mmHg — ABNORMAL HIGH (ref 83.0–108.0)

## 2018-09-05 LAB — CREATININE, SERUM: Creatinine, Ser: 0.69 mg/dL (ref 0.44–1.00)

## 2018-09-05 LAB — PREPARE PLATELET PHERESIS
Unit division: 0
Unit division: 0

## 2018-09-05 LAB — CBC WITH DIFFERENTIAL/PLATELET
Abs Immature Granulocytes: 0.02 10*3/uL (ref 0.00–0.07)
Basophils Absolute: 0 10*3/uL (ref 0.0–0.1)
Basophils Relative: 1 %
Eosinophils Absolute: 0.1 10*3/uL (ref 0.0–0.5)
Eosinophils Relative: 1 %
HCT: 31.6 % — ABNORMAL LOW (ref 36.0–46.0)
Hemoglobin: 9.3 g/dL — ABNORMAL LOW (ref 12.0–15.0)
Immature Granulocytes: 0 %
Lymphocytes Relative: 66 %
Lymphs Abs: 4.7 10*3/uL — ABNORMAL HIGH (ref 0.7–4.0)
MCH: 25.9 pg — ABNORMAL LOW (ref 26.0–34.0)
MCHC: 29.4 g/dL — ABNORMAL LOW (ref 30.0–36.0)
MCV: 88 fL (ref 80.0–100.0)
Monocytes Absolute: 0.3 10*3/uL (ref 0.1–1.0)
Monocytes Relative: 5 %
Neutro Abs: 1.9 10*3/uL (ref 1.7–7.7)
Neutrophils Relative %: 27 %
Platelets: 412 10*3/uL — ABNORMAL HIGH (ref 150–400)
RBC: 3.59 MIL/uL — ABNORMAL LOW (ref 3.87–5.11)
RDW: 14.2 % (ref 11.5–15.5)
WBC: 7.2 10*3/uL (ref 4.0–10.5)
nRBC: 0 % (ref 0.0–0.2)

## 2018-09-05 LAB — POCT I-STAT 4, (NA,K, GLUC, HGB,HCT)
Glucose, Bld: 158 mg/dL — ABNORMAL HIGH (ref 70–99)
HEMATOCRIT: 36 % (ref 36.0–46.0)
Hemoglobin: 12.2 g/dL (ref 12.0–15.0)
Potassium: 4 mmol/L (ref 3.5–5.1)
Sodium: 144 mmol/L (ref 135–145)

## 2018-09-05 LAB — BPAM CRYOPRECIPITATE
Blood Product Expiration Date: 202002081750
ISSUE DATE / TIME: 202002081242
Unit Type and Rh: 6200

## 2018-09-05 LAB — GLUCOSE, CAPILLARY: GLUCOSE-CAPILLARY: 106 mg/dL — AB (ref 70–99)

## 2018-09-05 LAB — MAGNESIUM: MAGNESIUM: 1.4 mg/dL — AB (ref 1.7–2.4)

## 2018-09-05 LAB — ABO/RH: ABO/RH(D): AB POS

## 2018-09-05 LAB — MASSIVE TRANSFUSION PROTOCOL ORDER (BLOOD BANK NOTIFICATION)

## 2018-09-05 LAB — DIC (DISSEMINATED INTRAVASCULAR COAGULATION) PANEL (NOT AT ARMC): Fibrinogen: 381 mg/dL (ref 210–475)

## 2018-09-05 SURGERY — EXAM UNDER ANESTHESIA
Anesthesia: General | Site: Uterus

## 2018-09-05 MED ORDER — CEFAZOLIN SODIUM-DEXTROSE 2-3 GM-%(50ML) IV SOLR
INTRAVENOUS | Status: DC | PRN
  Administered 2018-09-05: 2 g via INTRAVENOUS

## 2018-09-05 MED ORDER — SODIUM CHLORIDE 0.9 % IV BOLUS
2000.0000 mL | Freq: Once | INTRAVENOUS | Status: AC
  Administered 2018-09-05: 2000 mL via INTRAVENOUS

## 2018-09-05 MED ORDER — ROCURONIUM BROMIDE 50 MG/5ML IV SOSY
PREFILLED_SYRINGE | INTRAVENOUS | Status: AC
  Filled 2018-09-05: qty 15

## 2018-09-05 MED ORDER — SIMETHICONE 80 MG PO CHEW
80.0000 mg | CHEWABLE_TABLET | Freq: Four times a day (QID) | ORAL | Status: DC | PRN
  Administered 2018-09-06 – 2018-09-09 (×3): 80 mg via ORAL
  Filled 2018-09-05 (×3): qty 1

## 2018-09-05 MED ORDER — MISOPROSTOL 200 MCG PO TABS
1000.0000 ug | ORAL_TABLET | Freq: Once | ORAL | Status: DC
  Filled 2018-09-05: qty 5

## 2018-09-05 MED ORDER — CLINDAMYCIN PHOSPHATE 900 MG/50ML IV SOLN
900.0000 mg | Freq: Three times a day (TID) | INTRAVENOUS | Status: DC
  Administered 2018-09-05 – 2018-09-08 (×8): 900 mg via INTRAVENOUS
  Filled 2018-09-05 (×10): qty 50

## 2018-09-05 MED ORDER — LIDOCAINE 2% (20 MG/ML) 5 ML SYRINGE
INTRAMUSCULAR | Status: AC
  Filled 2018-09-05: qty 5

## 2018-09-05 MED ORDER — ROCURONIUM BROMIDE 100 MG/10ML IV SOLN
INTRAVENOUS | Status: DC | PRN
  Administered 2018-09-05: 50 mg via INTRAVENOUS
  Administered 2018-09-05: 30 mg via INTRAVENOUS
  Administered 2018-09-05: 50 mg via INTRAVENOUS

## 2018-09-05 MED ORDER — METHYLERGONOVINE MALEATE 0.2 MG/ML IJ SOLN
INTRAMUSCULAR | Status: DC | PRN
  Administered 2018-09-05: 0.2 mg via INTRAMUSCULAR

## 2018-09-05 MED ORDER — LACTATED RINGERS IV SOLN
INTRAVENOUS | Status: DC | PRN
  Administered 2018-09-05: 12:00:00 via INTRAVENOUS

## 2018-09-05 MED ORDER — SODIUM CHLORIDE 0.9 % IV SOLN
INTRAVENOUS | Status: DC | PRN
  Administered 2018-09-05 (×4): via INTRAVENOUS

## 2018-09-05 MED ORDER — DOCUSATE SODIUM 100 MG PO CAPS
100.0000 mg | ORAL_CAPSULE | Freq: Two times a day (BID) | ORAL | Status: DC
  Administered 2018-09-05 – 2018-09-09 (×8): 100 mg via ORAL
  Filled 2018-09-05 (×8): qty 1

## 2018-09-05 MED ORDER — EPHEDRINE 5 MG/ML INJ
INTRAVENOUS | Status: AC
  Filled 2018-09-05: qty 10

## 2018-09-05 MED ORDER — MEPERIDINE HCL 50 MG/ML IJ SOLN: 6.2500 mg | INTRAMUSCULAR | Status: DC | PRN

## 2018-09-05 MED ORDER — FENTANYL CITRATE (PF) 250 MCG/5ML IJ SOLN
INTRAMUSCULAR | Status: AC
  Filled 2018-09-05: qty 5

## 2018-09-05 MED ORDER — CEFAZOLIN SODIUM-DEXTROSE 2-4 GM/100ML-% IV SOLN
2.0000 g | Freq: Three times a day (TID) | INTRAVENOUS | Status: DC
  Filled 2018-09-05: qty 100

## 2018-09-05 MED ORDER — SUGAMMADEX SODIUM 200 MG/2ML IV SOLN
INTRAVENOUS | Status: DC | PRN
  Administered 2018-09-05: 200 mg via INTRAVENOUS

## 2018-09-05 MED ORDER — METHYLENE BLUE 0.5 % INJ SOLN
INTRAVENOUS | Status: AC
  Filled 2018-09-05: qty 10

## 2018-09-05 MED ORDER — HYDROMORPHONE HCL 1 MG/ML IJ SOLN
0.5000 mg | INTRAMUSCULAR | Status: DC | PRN
  Administered 2018-09-06: 0.5 mg via INTRAVENOUS
  Filled 2018-09-05: qty 0.5

## 2018-09-05 MED ORDER — BISACODYL 5 MG PO TBEC
5.0000 mg | DELAYED_RELEASE_TABLET | Freq: Every day | ORAL | Status: DC | PRN
  Filled 2018-09-05: qty 1

## 2018-09-05 MED ORDER — ONDANSETRON HCL 4 MG/2ML IJ SOLN: 4.0000 mg | Freq: Once | INTRAMUSCULAR | Status: DC | PRN

## 2018-09-05 MED ORDER — NOREPINEPHRINE 4 MG/250ML-% IV SOLN
0.0000 ug/min | INTRAVENOUS | Status: DC
  Administered 2018-09-05: 2 ug/min via INTRAVENOUS

## 2018-09-05 MED ORDER — ROCURONIUM BROMIDE 50 MG/5ML IV SOSY
PREFILLED_SYRINGE | INTRAVENOUS | Status: AC
  Filled 2018-09-05: qty 5

## 2018-09-05 MED ORDER — ONDANSETRON HCL 4 MG/2ML IJ SOLN
4.0000 mg | Freq: Four times a day (QID) | INTRAMUSCULAR | Status: DC | PRN
  Administered 2018-09-06: 4 mg via INTRAVENOUS
  Filled 2018-09-05: qty 2

## 2018-09-05 MED ORDER — HYDROMORPHONE HCL 1 MG/ML IJ SOLN
0.2500 mg | INTRAMUSCULAR | Status: DC | PRN
  Administered 2018-09-05 (×2): 0.5 mg via INTRAVENOUS

## 2018-09-05 MED ORDER — MIDAZOLAM HCL 2 MG/2ML IJ SOLN
INTRAMUSCULAR | Status: AC
  Filled 2018-09-05: qty 2

## 2018-09-05 MED ORDER — METHYLENE BLUE 0.5 % INJ SOLN
INTRAVENOUS | Status: DC | PRN
  Administered 2018-09-05: 25 mg via INTRAVENOUS

## 2018-09-05 MED ORDER — PHENYLEPHRINE 40 MCG/ML (10ML) SYRINGE FOR IV PUSH (FOR BLOOD PRESSURE SUPPORT)
PREFILLED_SYRINGE | INTRAVENOUS | Status: AC
  Filled 2018-09-05: qty 10

## 2018-09-05 MED ORDER — ONDANSETRON HCL 4 MG/2ML IJ SOLN
INTRAMUSCULAR | Status: DC | PRN
  Administered 2018-09-05: 4 mg via INTRAVENOUS

## 2018-09-05 MED ORDER — ETOMIDATE 2 MG/ML IV SOLN
INTRAVENOUS | Status: DC | PRN
  Administered 2018-09-05: 12 mg via INTRAVENOUS

## 2018-09-05 MED ORDER — MISOPROSTOL 100 MCG PO TABS
ORAL_TABLET | ORAL | Status: DC | PRN
  Administered 2018-09-05: 1000 ug

## 2018-09-05 MED ORDER — GENTAMICIN SULFATE 40 MG/ML IJ SOLN
400.0000 mg | INTRAVENOUS | Status: DC
  Administered 2018-09-05: 400 mg via INTRAVENOUS
  Filled 2018-09-05 (×2): qty 10

## 2018-09-05 MED ORDER — PANTOPRAZOLE SODIUM 40 MG PO TBEC
40.0000 mg | DELAYED_RELEASE_TABLET | Freq: Every day | ORAL | Status: DC
  Administered 2018-09-06 – 2018-09-09 (×4): 40 mg via ORAL
  Filled 2018-09-05 (×4): qty 1

## 2018-09-05 MED ORDER — ROCURONIUM BROMIDE 50 MG/5ML IV SOSY
PREFILLED_SYRINGE | INTRAVENOUS | Status: DC | PRN
  Administered 2018-09-05: 50 mg via INTRAVENOUS

## 2018-09-05 MED ORDER — SUCCINYLCHOLINE CHLORIDE 200 MG/10ML IV SOSY
PREFILLED_SYRINGE | INTRAVENOUS | Status: AC
  Filled 2018-09-05: qty 20

## 2018-09-05 MED ORDER — OXYTOCIN 10 UNIT/ML IJ SOLN
INTRAVENOUS | Status: DC | PRN
  Administered 2018-09-05: 20 [IU] via INTRAVENOUS

## 2018-09-05 MED ORDER — CARBOPROST TROMETHAMINE 250 MCG/ML IM SOLN
INTRAMUSCULAR | Status: DC | PRN
  Administered 2018-09-05: 250 ug via INTRAMUSCULAR

## 2018-09-05 MED ORDER — ENOXAPARIN SODIUM 40 MG/0.4ML ~~LOC~~ SOLN
40.0000 mg | SUBCUTANEOUS | Status: DC
  Administered 2018-09-06 – 2018-09-09 (×3): 40 mg via SUBCUTANEOUS
  Filled 2018-09-05 (×4): qty 0.4

## 2018-09-05 MED ORDER — MIDAZOLAM HCL 5 MG/5ML IJ SOLN
INTRAMUSCULAR | Status: DC | PRN
  Administered 2018-09-05: 2 mg via INTRAVENOUS
  Administered 2018-09-05: 0.5 mg via INTRAVENOUS

## 2018-09-05 MED ORDER — SUCCINYLCHOLINE CHLORIDE 20 MG/ML IJ SOLN
INTRAMUSCULAR | Status: DC | PRN
  Administered 2018-09-05: 140 mg via INTRAVENOUS

## 2018-09-05 MED ORDER — LACTATED RINGERS IV SOLN
INTRAVENOUS | Status: DC
  Administered 2018-09-05 – 2018-09-08 (×6): via INTRAVENOUS

## 2018-09-05 MED ORDER — ONDANSETRON HCL 4 MG/2ML IJ SOLN
INTRAMUSCULAR | Status: AC
  Filled 2018-09-05: qty 2

## 2018-09-05 MED ORDER — MAGNESIUM HYDROXIDE 400 MG/5ML PO SUSP: 30.0000 mL | Freq: Every day | ORAL | Status: DC | PRN

## 2018-09-05 MED ORDER — ETOMIDATE 2 MG/ML IV SOLN
INTRAVENOUS | Status: AC
  Filled 2018-09-05: qty 10

## 2018-09-05 MED ORDER — ACETAMINOPHEN 325 MG PO TABS: 650.0000 mg | ORAL_TABLET | ORAL | Status: DC | PRN

## 2018-09-05 MED ORDER — DEXAMETHASONE SODIUM PHOSPHATE 10 MG/ML IJ SOLN
INTRAMUSCULAR | Status: DC | PRN
  Administered 2018-09-05: 5 mg via INTRAVENOUS

## 2018-09-05 MED ORDER — SODIUM CHLORIDE 0.9 % IV SOLN
2.0000 g | Freq: Four times a day (QID) | INTRAVENOUS | Status: AC
  Administered 2018-09-05 – 2018-09-07 (×8): 2 g via INTRAVENOUS
  Filled 2018-09-05 (×3): qty 2
  Filled 2018-09-05 (×4): qty 2000
  Filled 2018-09-05: qty 2
  Filled 2018-09-05: qty 2000
  Filled 2018-09-05: qty 2

## 2018-09-05 MED ORDER — METHYLERGONOVINE MALEATE 0.2 MG/ML IJ SOLN
0.2000 mg | Freq: Once | INTRAMUSCULAR | Status: DC
  Filled 2018-09-05: qty 1

## 2018-09-05 MED ORDER — ONDANSETRON HCL 4 MG PO TABS: 4.0000 mg | ORAL_TABLET | Freq: Four times a day (QID) | ORAL | Status: DC | PRN

## 2018-09-05 MED ORDER — 0.9 % SODIUM CHLORIDE (POUR BTL) OPTIME
TOPICAL | Status: DC | PRN
  Administered 2018-09-05: 3000 mL

## 2018-09-05 MED ORDER — FENTANYL CITRATE (PF) 100 MCG/2ML IJ SOLN
INTRAMUSCULAR | Status: DC | PRN
  Administered 2018-09-05: 50 ug via INTRAVENOUS
  Administered 2018-09-05: 100 ug via INTRAVENOUS
  Administered 2018-09-05: 150 ug via INTRAVENOUS
  Administered 2018-09-05: 50 ug via INTRAVENOUS

## 2018-09-05 MED ORDER — OXYCODONE HCL 5 MG PO TABS
5.0000 mg | ORAL_TABLET | ORAL | Status: DC | PRN
  Administered 2018-09-05 – 2018-09-09 (×6): 5 mg via ORAL
  Filled 2018-09-05 (×6): qty 1

## 2018-09-05 MED ORDER — HYDROMORPHONE HCL 1 MG/ML IJ SOLN
INTRAMUSCULAR | Status: AC
  Administered 2018-09-05: 0.5 mg via INTRAVENOUS
  Filled 2018-09-05: qty 1

## 2018-09-05 SURGICAL SUPPLY — 51 items
CANISTER SUCT 3000ML PPV (MISCELLANEOUS) ×6 IMPLANT
COVER LIGHT HANDLE  DEROYL (MISCELLANEOUS) ×6 IMPLANT
COVER WAND RF STERILE (DRAPES) ×6 IMPLANT
DECANTER SPIKE VIAL GLASS SM (MISCELLANEOUS) IMPLANT
DRAPE LAPAROSCOPIC ABDOMINAL (DRAPES) ×12 IMPLANT
DRSG OPSITE 11X17.75 LRG (GAUZE/BANDAGES/DRESSINGS) ×6 IMPLANT
ELECT BLADE 4.0 EZ CLEAN MEGAD (MISCELLANEOUS) ×12
ELECT REM PT RETURN 9FT ADLT (ELECTROSURGICAL) ×6
ELECTRODE BLDE 4.0 EZ CLN MEGD (MISCELLANEOUS) ×8 IMPLANT
ELECTRODE REM PT RTRN 9FT ADLT (ELECTROSURGICAL) ×4 IMPLANT
GAUZE PACKING 2X5 YD STRL (GAUZE/BANDAGES/DRESSINGS) IMPLANT
GAUZE SPONGE 4X4 16PLY XRAY LF (GAUZE/BANDAGES/DRESSINGS) ×12 IMPLANT
GLOVE BIOGEL PI IND STRL 6.5 (GLOVE) ×4 IMPLANT
GLOVE BIOGEL PI IND STRL 7.0 (GLOVE) ×4 IMPLANT
GLOVE BIOGEL PI INDICATOR 6.5 (GLOVE) ×2
GLOVE BIOGEL PI INDICATOR 7.0 (GLOVE) ×2
GLOVE ECLIPSE 7.0 STRL STRAW (GLOVE) ×12 IMPLANT
GLOVE ORTHOPEDIC STR SZ6.5 (GLOVE) ×6 IMPLANT
GOWN STRL REUS W/ TWL LRG LVL3 (GOWN DISPOSABLE) ×16 IMPLANT
GOWN STRL REUS W/TWL LRG LVL3 (GOWN DISPOSABLE) ×8
HEMOSTAT ARISTA ABSORB 1G (HEMOSTASIS) ×12 IMPLANT
LEGGING LITHOTOMY PAIR STRL (DRAPES) ×6 IMPLANT
NEEDLE HYPO 22GX1.5 SAFETY (NEEDLE) IMPLANT
NEEDLE SPNL 18GX3.5 QUINCKE PK (NEEDLE) IMPLANT
NS IRRIG 1000ML POUR BTL (IV SOLUTION) ×6 IMPLANT
PACK C SECTION WH (CUSTOM PROCEDURE TRAY) ×6 IMPLANT
PACK VAGINAL WOMENS (CUSTOM PROCEDURE TRAY) ×6 IMPLANT
PAD ABD 7.5X8 STRL (GAUZE/BANDAGES/DRESSINGS) ×12 IMPLANT
PAD OB MATERNITY 4.3X12.25 (PERSONAL CARE ITEMS) ×6 IMPLANT
PENCIL BUTTON HOLSTER BLD 10FT (ELECTRODE) ×6 IMPLANT
RETRACTOR WND ALEXIS 25 LRG (MISCELLANEOUS) ×4 IMPLANT
RTRCTR WOUND ALEXIS 25CM LRG (MISCELLANEOUS) ×6
SET BERKELEY SUCTION TUBING (SUCTIONS) ×6 IMPLANT
SLEEVE SCD COMPRESS THIGH SM (STOCKING) ×6 IMPLANT
SPECIMEN JAR MEDIUM (MISCELLANEOUS) ×6 IMPLANT
SPONGE LAP 18X18 X RAY DECT (DISPOSABLE) ×42 IMPLANT
SUCTION POOLE TIP (SUCTIONS) ×6 IMPLANT
SUT MNCRL AB 4-0 PS2 18 (SUTURE) ×6 IMPLANT
SUT VIC AB 0 CT1 18XCR BRD 8 (SUTURE) ×12 IMPLANT
SUT VIC AB 0 CT1 18XCR BRD8 (SUTURE) IMPLANT
SUT VIC AB 0 CT1 27 (SUTURE) ×6
SUT VIC AB 0 CT1 27XBRD ANBCTR (SUTURE) ×12 IMPLANT
SUT VIC AB 0 CT1 8-18 (SUTURE) ×6
SUT VIC AB 0 CTX 36 (SUTURE) ×4
SUT VIC AB 0 CTX36XBRD ANTBCTR (SUTURE) ×8 IMPLANT
SUT VICRYL 0 TIES 12 18 (SUTURE) IMPLANT
SYR 20CC LL (SYRINGE) ×6 IMPLANT
TAPE CLOTH SURG 6X10 WHT LF (GAUZE/BANDAGES/DRESSINGS) ×6 IMPLANT
TOWEL GREEN STERILE FF (TOWEL DISPOSABLE) ×12 IMPLANT
TRAY FOLEY W/BAG SLVR 14FR (SET/KITS/TRAYS/PACK) ×6 IMPLANT
VACURETTE 12 RIGID CVD (CANNULA) ×6 IMPLANT

## 2018-09-05 NOTE — H&P (Signed)
Late Entry  H&P  Called by ED attending for massive hemorrhage in patient who is 3 week s/p c-section at [facility]. I presented to [facility] from [facility], patient was in process of having right femoral central line placed as well as was undergoing transfusion via massive transfusion protocol. Patient drowsy but able to give history.  She reports elective repeat c-section at [facility] on 08/11/18, complicated by "nicking" the artery and that she received 2 units blood post op. Also reports urology was called but she is not sure if she had any repairs, she did not go home with catheter. Reports no other complications with pregnancy, denies any other medical problems. Denies any known allergies. H/o 4 x SVD then 2 c-sections.  Woke up today and had several gushes of blood from the vagina, and possibly lost consciousness. She was brought to ED via ambulance.  In ED, she was hypotensive and receiving blood.   Gen: drowsy but responsive Abd: soft, mildly tender, healing pfannenstiel GU: massive hemorrhage from cervix  In the ED, I was able to visualize the bleeding from the cervix by placing a tenaculum on the cervix. No visible laceration noted. Decision made to take patient to OR for exam under anesthesia, possible D&C, possible laparotomy, possible hysterectomy. I reviewed with the patient that she had massive bleeding that required review in the OR, that she may require the above procedures, including a hysterectomy in the event that we are unable to stop her bleeding. Reviewed with a hysterectomy, she would be unable to have future children, patient reports she has had BTL with c-section and is in agreement with plan, possible hysterectomy. Patient gave verbal consent for these procedures, no written consent obtained due to emergent nature of bleeding.   She was taken to OR emergently.   Please see operative report for details.  Baldemar Lenis, M.D. Attending [facility] [employer] [facility] and exercise psychologist)

## 2018-09-05 NOTE — Consult Note (Signed)
NAME:  Abigail Briggs, MRN:  [id number], DOB:  1986/05/21, LOS: 0 ADMISSION DATE:  09/05/2018, CONSULTATION DATE:  09/05/2018  REFERRING MD:  Leroy Libman MD, CHIEF COMPLAINT: Need for ICU monitoring after hysterectomy with large blood loss  Brief History   33 year old with no significant past medical history.  She had a C-section at the beginning of January at [facility].  Apparently the procedure was complicated by a cut of the artery requiring 2 units blood transfusion Came to the ED today with significant vaginal bleeding.  Taken for emergent hysterectomy Had significant blood loss requiring 8 units PRBC and 6 units FFP.  Patient extubated in the PACU CCM consulted for 24 hours of monitoring in the ICU.  Past Medical History  None  Significant Hospital Events   2/8 Admit, hysterectomy  Consults:  PCCM  Procedures:  Right radial A-line 2/8>   Significant Diagnostic Tests:    Micro Data:    Antimicrobials:    Interim history/subjective:    Objective   Blood pressure 119/82, pulse 98, temperature 97.6 F (36.4 C), resp. rate (!) 21, SpO2 100 %.        Intake/Output Summary (Last 24 hours) at 09/05/2018 1539 Last data filed at 09/05/2018 1438 Gross per 24 hour  Intake 4534 ml  Output 2650 ml  Net 1884 ml   There were no vitals filed for this visit.  Examination: Gen:      No acute distress HEENT:  EOMI, sclera anicteric Neck:     No masses; no thyromegaly Lungs:    Clear to auscultation bilaterally; normal respiratory effort CV:         Regular rate and rhythm; no murmurs Abd:      + bowel sounds; soft, non-tender; no palpable masses, no distension Ext:    No edema; adequate peripheral perfusion Skin:      Warm and dry; no rash Neuro: Somnolent, arousable  Resolved Hospital Problem list     Assessment & Plan:  33 year old 3 weeks out from C-section admitted with bleeding status post hysterectomy  Admit to the ICU overnight Follow CBC, check  PT/INR Transfuse as needed  Best practice:  Diet: NPO Pain/Anxiety/Delirium protocol (if indicated): NA VAP protocol (if indicated): NA DVT prophylaxis:SCDs GI prophylaxis: NA Glucose control: Monitor glucose Mobility: Bedrest Code Status: Full code Family Communication: Patient and husband updated at bedside Disposition: ICU  Labs   CBC: Recent Labs  Lab 09/05/18 1132 09/05/18 1302 09/05/18 1342 09/05/18 1531  WBC 7.2  --   --   --   NEUTROABS 1.9  --   --   --   HGB 9.3* 7.1* 9.5* 12.2  HCT 31.6* 21.0* 28.0* 36.0  MCV 88.0  --   --   --   PLT 456*  412*  --   --   --     Basic Metabolic Panel: Recent Labs  Lab 09/05/18 1132 09/05/18 1302 09/05/18 1342 09/05/18 1531  NA 141 142 143 144  K 3.7 3.5 4.0 4.0  CL 107  --   --   --   CO2 22  --   --   --   GLUCOSE 140*  --   --  158*  BUN 5*  --   --   --   CREATININE 0.89  --   --   --   CALCIUM 8.7*  --   --   --    GFR: CrCl cannot be calculated (Unknown ideal weight.). Recent Labs  Lab 09/05/18 1132  WBC 7.2    Liver Function Tests: Recent Labs  Lab 09/05/18 1132  AST 13*  ALT 10  ALKPHOS 48  BILITOT 0.4  PROT 6.3*  ALBUMIN 3.0*   No results for input(s): LIPASE, AMYLASE in the last 168 hours. No results for input(s): AMMONIA in the last 168 hours.  ABG    Component Value Date/Time   PHART 7.329 (L) 09/05/2018 1342   PCO2ART 42.8 09/05/2018 1342   PO2ART 462.0 (H) 09/05/2018 1342   HCO3 22.9 09/05/2018 1342   TCO2 24 09/05/2018 1342   ACIDBASEDEF 3.0 (H) 09/05/2018 1342   O2SAT 100.0 09/05/2018 1342     Coagulation Profile: Recent Labs  Lab 09/05/18 1132  INR 1.25    Cardiac Enzymes: No results for input(s): CKTOTAL, CKMB, CKMBINDEX, TROPONINI in the last 168 hours.  HbA1C: No results found for: HGBA1C  CBG: No results for input(s): GLUCAP in the last 168 hours.  Review of Systems:    All negative; except for those that are bolded, which indicate  positives.  Constitutional: weight loss, weight gain, night sweats, fevers, chills, fatigue, weakness.  HEENT: headaches, sore throat, sneezing, nasal congestion, post nasal drip, difficulty swallowing, tooth/dental problems, visual complaints, visual changes, ear aches. Neuro: difficulty with speech, weakness, numbness, ataxia. CV:  chest pain, orthopnea, PND, swelling in lower extremities, dizziness, palpitations, syncope.  Resp: cough, hemoptysis, dyspnea, wheezing. GI: heartburn, indigestion, abdominal pain, nausea, vomiting, diarrhea, constipation, change in bowel habits, loss of appetite, hematemesis, melena, hematochezia.  GU: dysuria, change in color of urine, urgency or frequency, flank pain, hematuria. MSK: joint pain or swelling, decreased range of motion. Psych: change in mood or affect, depression, anxiety, suicidal ideations, homicidal ideations. Skin: rash, itching, bruising.  Past Medical History  She,  has no past medical history on file.   Surgical History    Past Surgical History:  Procedure Laterality Date  . CESAREAN SECTION       Social History   reports that she has never smoked. She has never used smokeless tobacco. She reports previous alcohol use. She reports that she does not use drugs.   Family History   Her family history is not on file.   Allergies No Known Allergies   Home Medications  Prior to Admission medications   Not on File    The patient is critically ill with multiple organ system failure and requires high complexity decision making for assessment and support, frequent evaluation and titration of therapies, advanced monitoring, review of radiographic studies and interpretation of complex data.   Critical Care Time devoted to patient care services, exclusive of separately billable procedures, described in this note is 35 minutes.   Chilton GreathousePraveen Tisha Cline MD [facility] Pager [phone] If no answer call [phone]  [phone] 09/05/2018, 3:42 PM

## 2018-09-05 NOTE — Op Note (Signed)
Abigail Briggs PROCEDURE DATE: 09/05/2018  PREOPERATIVE DIAGNOSES: hemorrhage after c-section  POSTOPERATIVE DIAGNOSES: retained products and suspected endometritis s/p c-section  PROCEDURE: Exam under anesthesia, dilation and curettage, supracervical hysterectomy, bilateral salpingectomy, cystoscopy  SURGEON:  Baldemar Lenis, MD  ASSISTANT:  Nicholaus Bloom, MD  ANESTHESIOLOGY TEAM: Anesthesiologist: Arta Bruce, MD CRNA: Edmonia Caprio, CRNA; Flowers, Rokoshi T, Scientist, clinical (histocompatibility and immunogenetics); Rogelia Boga, CRNA  INDICATIONS: Abigail Briggs is a 33 y.o. G6P6 here for bleeding after c-section 3 weeks ago. Briefly reviewed need for urgent EUA, possible D&C, possibly hysterectomy. Reviewed with hysterectomy, she would not be able to bear children. Reviewed would proceed with any other required procedures, patient verbalized understanding and gave verbal consent. No written consent obtained due to emergent nature of bleeding.    FINDINGS:  1. Cervix dilated 2 cm with massive bright red bleeding from cervix, significant retained products  2. Well healed pfannenstiel except for midline separation of skin approx 1 cm wide, thickened subcutaneous tissue, thickened rectus muscle with permanent sutures in place 3. boggy enlarged uterus with thickened area of tissue at left lower segment, bladder tacked high onto uterus, after bladder dissected down, hysterotomy noted to be intact and low on anterior lower uterine segment, evidence of suture at left uterine artery with appearance of prior ligation of uterine artery, overall uterine and cervical tissue friable with appearance of infection/endometritis 4. normal appearing ovaries, normal appearing fallopian tubes with evidence of prior tubal ligation via pomeroy procedure 5. bilateral ureteral jets   ANESTHESIA: General INTRAVENOUS FLUIDS: 4000 ml   ESTIMATED BLOOD LOSS: 5-6 Liters URINE OUTPUT:  650 ml SPECIMENS: Specimens sent to pathology 1. Uterus, bilateral fallopian  tubes 2. Retained products of conception COMPLICATIONS: None immediate  PROCEDURE IN DETAIL:  The patient was then taken to the operating room where she was placed under general anesthesia without difficulty. She had sequential compression devices applied to her lower extremities. She was then placed in a dorsal lithotomy position, a foley catheter was placed in the bladder under sterile technique and attached to gravity drainage. She was then prepped and draped in a sterile manner. A timeout was performed. A weighted speculum was placed into the vagina and a Sims retractor placed into anterior fornix, the cervix grasped with a single toothed tenaculum. The cervix was noted to be 2 cm dilated and a sharp large curette was placed into the uterus and the uterine walls were gently curetted. Significant products of conception were removed including membranes and pieces of placenta. The uterine curette was removed and the 12 mm suction catheter introduced. The suction curette was advanced to the fundus and activated and the curette was rotated to clear the uterus of products. The suction curette was removed and the bleeding was noted to slow. During this time, the patient had fundal massage and was given rectal cytotec 1000 mcg, IM hemabate and IM methergine with minimal improvement in tone. Fundus noted to be at level of umbilicus. The sharp curette was again advanced and more products of conception were removed. At this point, the bleeding increased again. No further products were able to be removed and bleeding remained significant. Decision made to convert to laparotomy with hysterectomy.   All instruments removed from vagina and patient placed in dorsal supine position. She was prepped and draped in sterile manner. An incision was made over her prior Pfannenstiel skin incision with scalpel and carried through to the underlying layer of fascia. The fascia was incised in the midline, and this incision  was  extended bilaterally using the Mayo scissors. The underlying rectus muscles were noted to the thickened and were not adhered to fascial tissue. Rectus muscles had been surgically closed with permanent appearing suture. A small defect was noted at the apex of the visualized area where the rectus muscles had been discussed off the fascia, and we were able to bluntly extend this enough to obtain entry into the peritoneum. The bovie was used to create a semi-Maylard incision bilaterally, taking care to ensure no underlying structures were attached. After cauterizing the muscles, the uterus came into view. The uterus was enlarged and boggy and was gently exteriorized for improved visualization.  The bowel was packed away with moist laparotomy sponges. The utero-ovarian ligament was doubly clamped on the left side, transected with Mayo scissors and suture ligated x2 to free the uterus from the adnexa. This procedure was repeated on the right side. The round ligaments were grasped within this pedicle as well. The bladder was noted to be tacked high onto the lower uterine segment. A bladder flap was then created across the anterior leaf of the broad ligament and the bladder was then bluntly and sharply dissected off the lower uterine segment and cervix with good hemostasis. Once the bladder was dissected down, the hysterotomy was able to be visualized. Once the bladder was dissected off the left lower uterine segment, the left uterine artery was visible with appearance of prior ligation.   The uterine arteries were then skeletonized bilaterally and then clamped, cut, and ligated with care given to prevent ureteral injury. The uterosacral ligaments were then clamped, cut, and ligated bilaterally. The uterus was then amputated across the lower uterine segment, significant bleeding noted from within cervix at this point. The pelvic sidewall ligaments continued to be clamped, cut and ligated to free the upper portion of the  cervix, which was then amputated with good hemostasis noted at this point. The anterior and posterior sides of cervix were then sutured together with a 0 Vicryl in running locked fashion, the cervical tissue was noted to be very friable and tore easily with any significant tension. A second layer of suture was placed across the cervical stump with 0 Vicryl for additional hemostasis to good effect. An area of bleeding on the right in the right pelvic pedicle near the uterine artery was improved with one figure of 8 suture with 2-0 Vicryl.  The distal portion of the right fallopian tube was grasped using a Babcock and the right fallopian tube was doubly clamped, cut and ligated from the adnexa using 0 vicryl. Inspection then showed hemostasis. The same procedure was then performed on the left fallopian tube. A small amount of oozing noted on the left pedicle near the IP ligament and this was clamped using a Minal Stuller and ligated with a free tie. Hemostasis noted at the left adnexa.The excised fallopian tubes were added to the specimen which was then sent to pathology.  The pelvis was irrigated with a small amount of warm normal saline and no areas of bleeding noted. Pelvis suctioned. Hemostasis was confirmed on all surfaces. Arista placed in pelvis for additional hemostasis. The left aspect of the rectus muscle had an area of bleeding which improved with several figure of 8 sutures of 2-0 Vicryl. Arista placed over this area for additional hemostasis. The fascia was then closed using 0 PDS in a running fashion.  The subcutaneous layer was irrigated, then reapproximated with 2-0 plain gut interrupted stitches.  The skin was closed with  a 4-0 Monocryl subcuticular stitch.  The patient was then put into dorsal lithotomy position and the foley was removed. She was prepped and the 70 degree cystoscope was inserted into her bladder. The bladder appeared normal. Bilateral ureteral jets were noted and the cystoscope was  removed. A foley was then placed under sterile conditions.   The patient tolerated the procedure well. Sponge, lap, instrument and needle counts were correct x 3. One instrument tray was not counted prior to incision and a post op xray was clear of foreign bodies. She was extubated in the OR and taken to the recovery room in stable condition.   She received a total of 8 units pRBCs and 6 units FFP. Blood loss in total estimated to be between 5 & 6 L. Patient taken to recovery area and then will be sent to ICU for monitoring overnight.    Baldemar LenisK. Meryl Brittinee Risk, M.D. Attending Obstetrician & Gynecologist, [facility], [facility]

## 2018-09-05 NOTE — Progress Notes (Signed)
RN returned two silver colored rings to husband at bedside in PACU.

## 2018-09-05 NOTE — ED Notes (Signed)
Pt received total of 4 units of PRBC and 4 bags of FFP.

## 2018-09-05 NOTE — Anesthesia Postprocedure Evaluation (Signed)
Anesthesia Post Note  Patient: Abigail Briggs  Procedure(s) Performed: Exam Under Anesthesia (Uterus) Dilatation And Curettage (Uterus) Abdominal supracervical hysterectomy with bilateral salpingectomy (Abdomen) Cystoscopy (Bladder)     Patient location during evaluation: PACU Anesthesia Type: General Level of consciousness: awake and alert Pain management: pain level controlled Vital Signs Assessment: post-procedure vital signs reviewed and stable Respiratory status: spontaneous breathing, nonlabored ventilation, respiratory function stable and patient connected to nasal cannula oxygen Cardiovascular status: blood pressure returned to baseline and stable Postop Assessment: no apparent nausea or vomiting Anesthetic complications: no    Last Vitals:  Vitals:   09/05/18 1625 09/05/18 1700  BP: 114/79   Pulse: 92   Resp: 19   Temp:  36.7 C  SpO2: 100%     Last Pain:  Vitals:   09/05/18 1700  TempSrc: Oral  PainSc:                  Malaiyah Achorn DAVID

## 2018-09-05 NOTE — Transfer of Care (Signed)
Immediate Anesthesia Transfer of Care Note  Patient: Abigail Briggs  Procedure(s) Performed: Exam Under Anesthesia (Uterus) Dilatation And Curettage (Uterus) Abdominal supracervical hysterectomy with bilateral salpingectomy (Abdomen) Cystoscopy (Bladder)  Patient Location: PACU  Anesthesia Type:General  Level of Consciousness: awake  Airway & Oxygen Therapy: Patient connected to face mask oxygen  Post-op Assessment: Report given to RN and Post -op Vital signs reviewed and stable  Post vital signs: Reviewed and stable  Last Vitals:  Vitals Value Taken Time  BP 114/79 09/05/2018  4:25 PM  Temp    Pulse 94 09/05/2018  4:25 PM  Resp 18 09/05/2018  4:25 PM  SpO2 100 % 09/05/2018  4:25 PM  Vitals shown include unvalidated device data.  Last Pain:  Vitals:   09/05/18 1600  PainSc: 4       Patients Stated Pain Goal: 4 (09/05/18 1600)  Complications: No apparent anesthesia complications

## 2018-09-05 NOTE — Anesthesia Preprocedure Evaluation (Signed)
Anesthesia Evaluation  Patient identified by MRN, date of birth, ID band Patient awake    Reviewed: Allergy & Precautions, NPO status , Patient's Chart, lab work & pertinent test results  Airway Mallampati: I  TM Distance: >3 FB Neck ROM: Full    Dental   Pulmonary    Pulmonary exam normal        Cardiovascular Normal cardiovascular exam     Neuro/Psych    GI/Hepatic   Endo/Other    Renal/GU      Musculoskeletal   Abdominal   Peds  Hematology   Anesthesia Other Findings   Reproductive/Obstetrics                             Anesthesia Physical Anesthesia Plan  ASA: III and emergent  Anesthesia Plan: General   Post-op Pain Management:    Induction: Intravenous, Rapid sequence and Cricoid pressure planned  PONV Risk Score and Plan: 3  Airway Management Planned: Oral ETT  Additional Equipment:   Intra-op Plan:   Post-operative Plan: Extubation in OR  Informed Consent: I have reviewed the patients History and Physical, chart, labs and discussed the procedure including the risks, benefits and alternatives for the proposed anesthesia with the patient or authorized representative who has indicated his/her understanding and acceptance.       Plan Discussed with: CRNA and Surgeon  Anesthesia Plan Comments:         Anesthesia Quick Evaluation

## 2018-09-05 NOTE — ED Notes (Signed)
Elaine-(CN)-OR called @ 1157-per Dr. Sula Soda by Marylene Land

## 2018-09-05 NOTE — Progress Notes (Signed)
Pharmacy Antibiotic Note  Abigail Briggs is a 33 y.o. female admitted on 09/05/2018 with enmodetritis.  Pharmacy has been consulted for gentamicin dosing. WBC wnl. SCr 0.69.    Plan: -Start gentamicin 5 mg /kg (~400 mg) q 24 hours  -Clindamycin 900 mg IV Q 8 hours per MD -Ampicillin 2 gm IV q 6 hours for PPROM -Will obtain a 24 hr gent level to ensure trough is <1 -Monitor renal fx      Temp (24hrs), Avg:97.7 F (36.5 C), Min:97.6 F (36.4 C), Max:98 F (36.7 C)  Recent Labs  Lab 09/05/18 1132  WBC 7.2  CREATININE 0.89    CrCl cannot be calculated (Unknown ideal weight.).    No Known Allergies  Antimicrobials this admission: Gentamicin 2/8 >>  Clindamycin 2/8>> Ampicillin 2/8 >>    Thank you for allowing pharmacy to be a part of this patient's care.  Vinnie Level, PharmD., BCPS Clinical Pharmacist Clinical phone for 09/05/18 until 8:30pm: [phone] If after 8:30pm, please refer to [facility] for unit-specific pharmacist

## 2018-09-05 NOTE — ED Provider Notes (Signed)
[facility] EMERGENCY DEPARTMENT Provider Note   CSN: [id number] Arrival date & time: 09/05/18  1115     History   Chief Complaint Chief Complaint  Patient presents with  . Post-op Problem    HPI Abigail Briggs is a 33 y.o. female.  HPI   32yF with vaginal bleeding. Delivered via c-section in the beginning of January in [facility]. Prior c-sections. Pt reports "cut an artery" during procedure and transfused 2u of blood. Had been recovering well since discharge up until today. Suddenly felt pelvic pressure and then felt a gush. Passed a lot of clots. Felt lightheaded and thinks had brief syncopal episode. Reports otherwise healthy. Not anticoagulated. Hypotensive 60s systolic for EMS. They noted a large amount of blood on scene with continued significantly bleeding during transfer. IV L AC prehospital with ~500cc of NS prior to arrival.  History reviewed. No pertinent past medical history.  There are no active problems to display for this patient.   Past Surgical History:  Procedure Laterality Date  . CESAREAN SECTION       OB History   No obstetric history on file.      Home Medications    Prior to Admission medications   Not on File    Family History No family history on file.  Social History Social History   Tobacco Use  . Smoking status: Never Smoker  . Smokeless tobacco: Never Used  Substance Use Topics  . Alcohol use: Not Currently  . Drug use: Never     Allergies   Patient has no known allergies.   Review of Systems Review of Systems  All systems reviewed and negative, other than as noted in HPI.  Physical Exam Updated Vital Signs BP 114/81   Pulse (!) 130   Resp (!) 34   SpO2 100%   Physical Exam Vitals signs and nursing note reviewed.  Constitutional:      Appearance: She is well-developed. She is obese. She is ill-appearing.     Comments: Drowsy and somewhat slow to respond but answering questions appropriately.  Pale appearance.   HENT:     Head: Normocephalic and atraumatic.  Eyes:     General:        Right eye: No discharge.        Left eye: No discharge.     Conjunctiva/sclera: Conjunctivae normal.  Neck:     Musculoskeletal: Neck supple.  Cardiovascular:     Rate and Rhythm: Regular rhythm. Tachycardia present.     Heart sounds: Normal heart sounds. No murmur. No friction rub. No gallop.   Pulmonary:     Effort: Pulmonary effort is normal. No respiratory distress.     Breath sounds: Normal breath sounds.  Abdominal:     Palpations: Abdomen is soft.     Tenderness: There is no abdominal tenderness.     Comments: Soft. c-section site intact and w/o concerning findings.   Genitourinary:    Comments: Clothes/bed soaked with blood. Copious amount of bright red blood blood and clot when digitally trying to evacuate the vagina. Blood reaccumulating too fast to identify exact source. Subsequently packed with kerlix.  Musculoskeletal:        General: No tenderness.  Skin:    General: Skin is warm and dry.     Coloration: Skin is pale.      ED Treatments / Results  Labs (all labs ordered are listed, but only abnormal results are displayed) Labs Reviewed  CBC WITH DIFFERENTIAL/PLATELET -  Abnormal; Notable for the following components:      Result Value   RBC 3.59 (*)    Hemoglobin 9.3 (*)    HCT 31.6 (*)    MCH 25.9 (*)    MCHC 29.4 (*)    Platelets 412 (*)    Lymphs Abs 4.7 (*)    All other components within normal limits  COMPREHENSIVE METABOLIC PANEL - Abnormal; Notable for the following components:   Glucose, Bld 140 (*)    BUN 5 (*)    Calcium 8.7 (*)    Total Protein 6.3 (*)    Albumin 3.0 (*)    AST 13 (*)    All other components within normal limits  DIC (DISSEMINATED INTRAVASCULAR COAGULATION) PANEL - Abnormal; Notable for the following components:   Platelets 456 (*)    All other components within normal limits  TYPE AND SCREEN  PREPARE FRESH FROZEN PLASMA  ABO/RH   PREPARE PLATELET PHERESIS  PREPARE CRYOPRECIPITATE  TYPE AND SCREEN  MASSIVE TRANSFUSION PROTOCOL ORDER (BLOOD BANK NOTIFICATION)    EKG None  Radiology No results found.  Procedures Procedures (including critical care time)  CRITICAL CARE Performed by: Raeford Razor Total critical care time: 40 minutes Critical care time was exclusive of separately billable procedures and treating other patients. Critical care was necessary to treat or prevent imminent or life-threatening deterioration. Critical care was time spent personally by me on the following activities: development of treatment plan with patient and/or surrogate as well as nursing, discussions with consultants, evaluation of patient's response to treatment, examination of patient, obtaining history from patient or surrogate, ordering and performing treatments and interventions, ordering and review of laboratory studies, ordering and review of radiographic studies, pulse oximetry and re-evaluation of patient's condition.  CENTRAL LINE Performed by: Raeford Razor Consent: The procedure was performed in an emergent situation. Required items: required blood products, implants, devices, and special equipment available Patient identity confirmed: arm band and provided demographic data Time out: Immediately prior to procedure a "time out" was called to verify the correct patient, procedure, equipment, support staff and site/side marked as required. Indications: vascular access Anesthesia: local infiltration Local anesthetic: lidocaine 1% w/oepinephrine Anesthetic total: 3 ml Patient sedated: no Preparation: skin prepped with 2% chlorhexidine Skin prep agent dried: skin prep agent completely dried prior to procedure Sterile barriers: all five maximum sterile barriers used - cap, mask, sterile gown, sterile gloves, and large sterile sheet Hand hygiene: hand hygiene performed prior to central venous catheter insertion  Location  details: R femoral  Catheter type: triple lumen Catheter size: 8 Fr Pre-procedure: landmarks identified Ultrasound guidance: yes Successful placement: yes Post-procedure: line sutured and dressing applied Assessment: blood return through all parts, free fluid flow. Patient tolerance: Patient tolerated the procedure well with no immediate complications.  Medications Ordered in ED Medications  sodium chloride 0.9 % bolus 2,000 mL (2,000 mLs Intravenous New Bag/Given 09/05/18 1139)  norepinephrine (LEVOPHED) 4mg  in premix infusion (4 mcg/min Intravenous Rate/Dose Change 09/05/18 1144)     Initial Impression / Assessment and Plan / ED Course  I have reviewed the triage vital signs and the nursing notes.  Pertinent labs & imaging results that were available during my care of the patient were reviewed by me and considered in my medical decision making (see chart for details).     32yF with vaginal bleeding. ~1 month post-partum after hysterectomy. Pt hypotensive in the field and had syncopal event. Estimated 500cc blood pre-hospital. Pt with a large amount  of bright red blood and clot that re accumulated as quick as I could evacuate and I couldn't clearly identify the source. C-section site looked fine. It was immediately obvious that she needed emergent surgical evaluation. Dr Earlene Plateravis, OB/GYN emergently consulted. Vagina packed with kerlix. Massive transfusion protocol initiated. R femoral TLC placed. Levophed started. Dr Earlene Plateravis expeditiously to bedside and pt to OR.   Final Clinical Impressions(s) / ED Diagnoses   Final diagnoses:  Vaginal hemorrhage  Acute blood loss anemia    ED Discharge Orders    None       Raeford RazorKohut, Kahleah Crass, MD 09/05/18 1230

## 2018-09-05 NOTE — ED Triage Notes (Signed)
Pt 6 weeks post C section- pt required blood transfusions during procedure due to possible artery being clipped, per EMS. Today pt passed two large clots and continued to bleed. EMS reports pt had syncopal episode prior to their arrival, pt alert to verbal stimuli. EMS states approx blood on floor.  BP 60 systolic, HR 90. Pt more alert on arrival to ED, continues to have clots. Pt on nonrebreather.

## 2018-09-05 NOTE — Brief Op Note (Signed)
09/05/2018  3:17 PM  PATIENT:  Abigail Briggs  33 y.o. female  PRE-OPERATIVE DIAGNOSIS:  Bleeding s/p c-section  POST-OPERATIVE DIAGNOSIS: retained products of conception s/p c-section, endometritis  PROCEDURE: Exam under anesthesia, dilation and curettage, Supracervical hysterectomy, bilateral salpingectomy, cystoscopy  SURGEON:  Surgeon(s) and Role:    Conan Bowens, MD - Primary    * Marice Potter, Leanora Ivanoff, MD - Assisting  PHYSICIAN ASSISTANT:   ASSISTANTS: none   ANESTHESIA:   general  EBL:  2000 mL   BLOOD ADMINISTERED: 8 units pRBCs, 6 units FFP  DRAINS: none   LOCAL MEDICATIONS USED:  NONE  SPECIMEN:  1. Uterus, bilateral fallopian tubes  2. Retained products of conception  DISPOSITION OF SPECIMEN:  PATHOLOGY  COUNTS:  D&C tray not counted prior to incision, xray post op clear, all other counts correct  TOURNIQUET:  * No tourniquets in log *  DICTATION: .Note written in EPIC  PLAN OF CARE: Admit to inpatient   PATIENT DISPOSITION:  ICU - extubated and stable.   Delay start of Pharmacological VTE agent (>24hrs) due to surgical blood loss or risk of bleeding: yes

## 2018-09-05 NOTE — Anesthesia Procedure Notes (Signed)
Procedure Name: Intubation Date/Time: 09/05/2018 12:20 PM Performed by: Kyung Rudd, CRNA Pre-anesthesia Checklist: Patient identified, Emergency Drugs available, Suction available, Patient being monitored and Timeout performed Patient Re-evaluated:Patient Re-evaluated prior to induction Oxygen Delivery Method: Circle system utilized Preoxygenation: Pre-oxygenation with 100% oxygen Induction Type: IV induction, Rapid sequence and Cricoid Pressure applied Laryngoscope Size: Mac and 3 Grade View: Grade I Tube type: Oral Tube size: 7.0 mm Number of attempts: 1 Airway Equipment and Method: Stylet Placement Confirmation: ETT inserted through vocal cords under direct vision,  positive ETCO2 and breath sounds checked- equal and bilateral Tube secured with: Tape Dental Injury: Teeth and Oropharynx as per pre-operative assessment

## 2018-09-05 NOTE — Anesthesia Procedure Notes (Signed)
Arterial Line Insertion Start/End2/02/2019 12:50 PM, 09/05/2018 12:58 PM Performed by: Quentin Ore, CRNA, CRNA  Patient location: OR. Preanesthetic checklist: patient identified, IV checked, site marked, risks and benefits discussed, surgical consent, monitors and equipment checked, pre-op evaluation and timeout performed Patient sedated Right, radial was placed Catheter size: 20 G Hand hygiene performed , maximum sterile barriers used  and Seldinger technique used Allen's test indicative of satisfactory collateral circulation Attempts: 2 Procedure performed without using ultrasound guided technique. Following insertion, Biopatch and dressing applied. Post procedure assessment: normal  Patient tolerated the procedure well with no immediate complications.

## 2018-09-06 ENCOUNTER — Inpatient Hospital Stay (HOSPITAL_COMMUNITY): Payer: Medicaid Other

## 2018-09-06 ENCOUNTER — Other Ambulatory Visit: Payer: Self-pay

## 2018-09-06 LAB — TYPE AND SCREEN
ABO/RH(D): AB POS
Antibody Screen: NEGATIVE
Unit division: 0
Unit division: 0
Unit division: 0
Unit division: 0
Unit division: 0
Unit division: 0
Unit division: 0
Unit division: 0
Unit division: 0
Unit division: 0
Unit division: 0
Unit division: 0
Unit division: 0
Unit division: 0
Unit division: 0
Unit division: 0
Unit division: 0
Unit division: 0

## 2018-09-06 LAB — PREPARE FRESH FROZEN PLASMA
Unit division: 0
Unit division: 0
Unit division: 0
Unit division: 0
Unit division: 0
Unit division: 0
Unit division: 0
Unit division: 0
Unit division: 0

## 2018-09-06 LAB — BPAM FFP
BLOOD PRODUCT EXPIRATION DATE: 202002132359
BLOOD PRODUCT EXPIRATION DATE: 202002292359
Blood Product Expiration Date: 202002112359
Blood Product Expiration Date: 202002112359
Blood Product Expiration Date: 202002112359
Blood Product Expiration Date: 202002112359
Blood Product Expiration Date: 202002132359
Blood Product Expiration Date: 202002182359
Blood Product Expiration Date: 202002182359
Blood Product Expiration Date: 202002242359
Blood Product Expiration Date: 202002252359
Blood Product Expiration Date: 202002272359
ISSUE DATE / TIME: 202002081120
ISSUE DATE / TIME: 202002081120
ISSUE DATE / TIME: 202002081130
ISSUE DATE / TIME: 202002081130
ISSUE DATE / TIME: 202002081139
ISSUE DATE / TIME: 202002081139
ISSUE DATE / TIME: 202002081139
ISSUE DATE / TIME: 202002081216
ISSUE DATE / TIME: 202002081216
ISSUE DATE / TIME: 202002081218
ISSUE DATE / TIME: 202002081218
ISSUE DATE / TIME: 202002081139
UNIT TYPE AND RH: 6200
UNIT TYPE AND RH: 6200
Unit Type and Rh: 6200
Unit Type and Rh: 6200
Unit Type and Rh: 6200
Unit Type and Rh: 6200
Unit Type and Rh: 6200
Unit Type and Rh: 6200
Unit Type and Rh: 6200
Unit Type and Rh: 6200
Unit Type and Rh: 6200
Unit Type and Rh: 6200

## 2018-09-06 LAB — BPAM RBC
BLOOD PRODUCT EXPIRATION DATE: 202003072359
BLOOD PRODUCT EXPIRATION DATE: 202003072359
Blood Product Expiration Date: 202002232359
Blood Product Expiration Date: 202002242359
Blood Product Expiration Date: 202002262359
Blood Product Expiration Date: 202002262359
Blood Product Expiration Date: 202003032359
Blood Product Expiration Date: 202003052359
Blood Product Expiration Date: 202003062359
Blood Product Expiration Date: 202003062359
Blood Product Expiration Date: 202003072359
Blood Product Expiration Date: 202003072359
Blood Product Expiration Date: 202003072359
Blood Product Expiration Date: 202003072359
Blood Product Expiration Date: 202003072359
Blood Product Expiration Date: 202003082359
Blood Product Expiration Date: 202003112359
Blood Product Expiration Date: 202003112359
ISSUE DATE / TIME: 202002081120
ISSUE DATE / TIME: 202002081120
ISSUE DATE / TIME: 202002081130
ISSUE DATE / TIME: 202002081138
ISSUE DATE / TIME: 202002081138
ISSUE DATE / TIME: 202002081138
ISSUE DATE / TIME: 202002081157
ISSUE DATE / TIME: 202002081157
ISSUE DATE / TIME: 202002081207
ISSUE DATE / TIME: 202002081209
ISSUE DATE / TIME: 202002081209
ISSUE DATE / TIME: 202002081224
ISSUE DATE / TIME: 202002081224
ISSUE DATE / TIME: 202002081130
ISSUE DATE / TIME: 202002081138
ISSUE DATE / TIME: 202002081207
UNIT TYPE AND RH: 6200
UNIT TYPE AND RH: 9500
Unit Type and Rh: 6200
Unit Type and Rh: 6200
Unit Type and Rh: 6200
Unit Type and Rh: 6200
Unit Type and Rh: 6200
Unit Type and Rh: 6200
Unit Type and Rh: 6200
Unit Type and Rh: 8400
Unit Type and Rh: 8400
Unit Type and Rh: 9500
Unit Type and Rh: 9500
Unit Type and Rh: 9500
Unit Type and Rh: 9500
Unit Type and Rh: 9500
Unit Type and Rh: 9500
Unit Type and Rh: 9500

## 2018-09-06 LAB — PROTIME-INR
INR: 1.37
Prothrombin Time: 16.7 seconds — ABNORMAL HIGH (ref 11.4–15.2)

## 2018-09-06 LAB — CBC
HCT: 32.2 % — ABNORMAL LOW (ref 36.0–46.0)
Hemoglobin: 10.9 g/dL — ABNORMAL LOW (ref 12.0–15.0)
MCH: 29.3 pg (ref 26.0–34.0)
MCHC: 33.9 g/dL (ref 30.0–36.0)
MCV: 86.6 fL (ref 80.0–100.0)
PLATELETS: 120 10*3/uL — AB (ref 150–400)
RBC: 3.72 MIL/uL — ABNORMAL LOW (ref 3.87–5.11)
RDW: 14.6 % (ref 11.5–15.5)
WBC: 16.1 10*3/uL — ABNORMAL HIGH (ref 4.0–10.5)
nRBC: 0 % (ref 0.0–0.2)

## 2018-09-06 LAB — BASIC METABOLIC PANEL
Anion gap: 9 (ref 5–15)
BUN: 8 mg/dL (ref 6–20)
CO2: 23 mmol/L (ref 22–32)
Calcium: 7.1 mg/dL — ABNORMAL LOW (ref 8.9–10.3)
Chloride: 109 mmol/L (ref 98–111)
Creatinine, Ser: 0.75 mg/dL (ref 0.44–1.00)
Glucose, Bld: 119 mg/dL — ABNORMAL HIGH (ref 70–99)
Potassium: 4.4 mmol/L (ref 3.5–5.1)
Sodium: 141 mmol/L (ref 135–145)

## 2018-09-06 LAB — MAGNESIUM: Magnesium: 1.4 mg/dL — ABNORMAL LOW (ref 1.7–2.4)

## 2018-09-06 LAB — PHOSPHORUS: Phosphorus: 3 mg/dL (ref 2.5–4.6)

## 2018-09-06 MED ORDER — MAGNESIUM SULFATE 2 GM/50ML IV SOLN
2.0000 g | Freq: Once | INTRAVENOUS | Status: AC
  Administered 2018-09-06: 2 g via INTRAVENOUS
  Filled 2018-09-06: qty 50

## 2018-09-06 MED ORDER — HYDROMORPHONE HCL 1 MG/ML IJ SOLN
1.0000 mg | INTRAMUSCULAR | Status: DC | PRN
  Administered 2018-09-06 – 2018-09-07 (×3): 1 mg via INTRAVENOUS
  Filled 2018-09-06 (×3): qty 1

## 2018-09-06 MED ORDER — GENTAMICIN SULFATE 40 MG/ML IJ SOLN
400.0000 mg | INTRAVENOUS | Status: DC
  Administered 2018-09-06 – 2018-09-07 (×2): 400 mg via INTRAVENOUS
  Filled 2018-09-06 (×3): qty 10

## 2018-09-06 MED ORDER — ZOLPIDEM TARTRATE 5 MG PO TABS: 5.0000 mg | ORAL_TABLET | Freq: Every evening | ORAL | Status: DC | PRN

## 2018-09-06 MED ORDER — KETOROLAC TROMETHAMINE 30 MG/ML IJ SOLN
30.0000 mg | Freq: Four times a day (QID) | INTRAMUSCULAR | Status: DC
  Administered 2018-09-06 – 2018-09-08 (×6): 30 mg via INTRAVENOUS
  Filled 2018-09-06 (×7): qty 1

## 2018-09-06 MED ORDER — FUROSEMIDE 20 MG PO TABS
20.0000 mg | ORAL_TABLET | Freq: Two times a day (BID) | ORAL | Status: AC
  Administered 2018-09-06 (×2): 20 mg via ORAL
  Filled 2018-09-06 (×2): qty 1

## 2018-09-06 MED FILL — Medication: Qty: 1 | Status: AC

## 2018-09-06 NOTE — Progress Notes (Signed)
0600 Attempted to ambulate pt. Pt unable to stand. Pt stated she was dizzy and felt nauseated at this time. Pt sat on the side of the bed for about  10 min then returned to bed. At that time it was noted that the pt urine had changed from green to a blood tinged color. No trauma had been caused to the catheter.  Dr Earlene Plater was made aware. Will continue to monitor the pt.

## 2018-09-06 NOTE — Progress Notes (Signed)
.  [facility] ADULT ICU REPLACEMENT PROTOCOL FOR AM LAB REPLACEMENT ONLY  The patient does apply for the [facility] Adult ICU Electrolyte Replacment Protocol based on the criteria listed below:   1. Is GFR >/= 40 ml/min? Yes.    Patient's GFR today is >60 2. Is urine output >/= 0.5 ml/kg/hr for the last 6 hours? Yes.   Patient's UOP is 0.69 ml/kg/hr 3. Is BUN < 60 mg/dL? Yes.    Patient's BUN today is 8 4. Abnormal electrolyte(s): Mag 1.4 5. Ordered repletion with: protocol 6. If a panic level lab has been reported, has the CCM MD in charge been notified? Yes.  .   Physician:  Dr.Sommer  Lolita Lenz 09/06/2018 6:02 AM

## 2018-09-06 NOTE — Progress Notes (Signed)
Patient ID: Abigail Briggs, female   DOB: 19-Apr-1986, 33 y.o.   MRN: [id number] Pt seen and examined in transfer from Dr. Earlene Plater. PE unchanged A/P stable Continue with present mamagement

## 2018-09-06 NOTE — OR Nursing (Signed)
Late note for 09/05/2018. Pt had all soft goods counted however the instruments were not all counted so an x-ray was required prior to pt leaving OR.

## 2018-09-06 NOTE — Consult Note (Addendum)
NAME:  Abigail Briggs, MRN:  [id number], DOB:  03/12/1986, LOS: 1 ADMISSION DATE:  09/05/2018, CONSULTATION DATE:  09/05/2018  REFERRING MD:  Leroy LibmanKelly Davis MD, CHIEF COMPLAINT: Need for ICU monitoring after hysterectomy with large blood loss  Brief History   33 year old with no significant past medical history.  She had a C-section at the beginning of January at [facility].  Apparently the procedure was complicated by a cut of the artery requiring 2 units blood transfusion Came to the ED today with significant vaginal bleeding.  Taken for emergent hysterectomy Had significant blood loss requiring 8 units PRBC and 6 units FFP.  Patient extubated in the PACU CCM consulted for 24 hours of monitoring in the ICU.  Past Medical History  None  Significant Hospital Events   2/8 Admit, hysterectomy  Consults:  PCCM  Procedures:  Right radial A-line 2/8>   Significant Diagnostic Tests:    Micro Data:    Antimicrobials:  Ampicillin gentamicin, clindamycin   Interim history/subjective:  No acute events overnight.  Objective   Blood pressure 125/76, pulse 79, temperature 98.8 F (37.1 C), temperature source Oral, resp. rate (!) 25, weight 108 kg, SpO2 97 %.        Intake/Output Summary (Last 24 hours) at 09/06/2018 1147 Last data filed at 09/06/2018 0700 Gross per 24 hour  Intake 7327.95 ml  Output 3450 ml  Net 3877.95 ml   Filed Weights   09/05/18 1806  Weight: 108 kg   Examination: Gen:      No acute distress HEENT:  EOMI, sclera anicteric Neck:     No masses; no thyromegaly Lungs:    Clear to auscultation bilaterally; normal respiratory effort CV:         Regular rate and rhythm; no murmurs Abd:      + bowel sounds; soft, non-tender; no palpable masses, no distension Ext:    No edema; adequate peripheral perfusion Skin:      Warm and dry; no rash Neuro: Somnolent, arousable  Resolved Hospital Problem list     Assessment & Plan:  33 year old 3 weeks out from C-section  admitted with bleeding status post hysterectomy  Stable overnight, hemoglobin slightly low but no active bleed Advance regular diet DC arterial, CVL line Lasix for volume overload  Noted plans by primary team for transfer out of ICU PCCM will be available as needed. Please call with questions  Best practice:  Diet: Diet Pain/Anxiety/Delirium protocol (if indicated): NA VAP protocol (if indicated): NA DVT prophylaxis:SCDs GI prophylaxis: NA Glucose control: Monitor glucose Mobility: OOB Code Status: Full code Family Communication: Patient and husband updated at bedside Disposition: Out of ICU  Labs   CBC: Recent Labs  Lab 09/05/18 1132  09/05/18 1342 09/05/18 1531 09/05/18 1659 09/05/18 2149 09/06/18 0432  WBC 7.2  --   --   --  19.6* 17.1* 16.1*  NEUTROABS 1.9  --   --   --   --   --   --   HGB 9.3*   < > 9.5* 12.2 13.3 12.2 10.9*  HCT 31.6*   < > 28.0* 36.0 40.9 35.9* 32.2*  MCV 88.0  --   --   --  89.9 87.6 86.6  PLT 456*  412*  --   --   --  131* 114* 120*   < > = values in this interval not displayed.    Basic Metabolic Panel: Recent Labs  Lab 09/05/18 1132 09/05/18 1302 09/05/18 1342 09/05/18 1531 09/05/18 1659  09/05/18 2149 09/06/18 0432  NA 141 142 143 144  --  140 141  K 3.7 3.5 4.0 4.0  --  4.1 4.4  CL 107  --   --   --   --  111 109  CO2 22  --   --   --   --  21* 23  GLUCOSE 140*  --   --  158*  --  141* 119*  BUN 5*  --   --   --   --  8 8  CREATININE 0.89  --   --   --  0.69 0.77 0.75  CALCIUM 8.7*  --   --   --   --  6.8* 7.1*  MG  --   --   --   --   --  1.4* 1.4*  PHOS  --   --   --   --   --   --  3.0   GFR: CrCl cannot be calculated (Unknown ideal weight.). Recent Labs  Lab 09/05/18 1132 09/05/18 1659 09/05/18 2149 09/06/18 0432  WBC 7.2 19.6* 17.1* 16.1*    Liver Function Tests: Recent Labs  Lab 09/05/18 1132  AST 13*  ALT 10  ALKPHOS 48  BILITOT 0.4  PROT 6.3*  ALBUMIN 3.0*   No results for input(s): LIPASE,  AMYLASE in the last 168 hours. No results for input(s): AMMONIA in the last 168 hours.  ABG    Component Value Date/Time   PHART 7.329 (L) 09/05/2018 1342   PCO2ART 42.8 09/05/2018 1342   PO2ART 462.0 (H) 09/05/2018 1342   HCO3 22.9 09/05/2018 1342   TCO2 24 09/05/2018 1342   ACIDBASEDEF 3.0 (H) 09/05/2018 1342   O2SAT 100.0 09/05/2018 1342     Coagulation Profile: Recent Labs  Lab 09/05/18 1132 09/05/18 1659 09/06/18 0432  INR 1.25 1.33 1.37    Cardiac Enzymes: No results for input(s): CKTOTAL, CKMB, CKMBINDEX, TROPONINI in the last 168 hours.  HbA1C: No results found for: HGBA1C  CBG: Recent Labs  Lab 09/05/18 1700  GLUCAP 106*   Thong Feeny MD [facility] Pager [phone] If no answer call [phone] 09/06/2018, 11:47 AM

## 2018-09-06 NOTE — Progress Notes (Signed)
Gynecology Progress Note/Transfer Note  Admission Date: 09/05/2018 Current Date: 09/06/2018 8:53 AM  Abigail Briggs is a 33 y.o. [id number] POD#1 s/p EUA, D&C, SCH, BS, cystoscopy for retained products and endometritis s/p repeat c-section.   History complicated by: Patient Active Problem List   Diagnosis Date Noted  . Status post surgery 09/05/2018  . Retained products of conception after delivery with complications 09/05/2018  . S/P C-section 09/05/2018    Subjective:  Patient feeling "exhausted" this am. She is nauseous but no vomiting, thinks it is because she hasn't eaten anything. Was dizzy with trying to stand up earlier this am but denies dizziness with lying down. Passing flatus, foley catheter in place. Pain moderately well controlled with PO pain meds. States vaginal bleeding is minimal  Objective:   Vitals:   09/06/18 0400 09/06/18 0600 09/06/18 0700 09/06/18 0724  BP: 114/76 120/83 131/79   Pulse: 77 86 82   Resp: (!) 21 (!) 29 (!) 23   Temp:    99.4 F (37.4 C)  TempSrc:    Oral  SpO2: 99% 99% 98%   Weight:        No intake/output data recorded.  Intake/Output Summary (Last 24 hours) at 09/06/2018 0853 Last data filed at 09/06/2018 0700 Gross per 24 hour  Intake 7327.95 ml  Output 3450 ml  Net 3877.95 ml     Physical exam: BP 131/79   Pulse 82   Temp 99.4 F (37.4 C) (Oral)   Resp (!) 23   Wt 108 kg   SpO2 98%  CONSTITUTIONAL: Well-developed, well-nourished female in no acute distress. Appears fatigued. HENT:  Normocephalic, atraumatic, External right and left ear normal. Oropharynx is clear and moist, Appears grossly mildly edematous around face EYES: Conjunctivae and EOM are normal. Pupils are equal, round, and reactive to light. No scleral icterus.  NECK: Normal range of motion, supple, no masses.  SKIN: Skin is warm and dry. No rash noted. Not diaphoretic. No erythema. No pallor. NEUROLOGIC: Alert and oriented to person, place, and time. Normal  reflexes, muscle tone coordination. No cranial nerve deficit noted. PSYCHIATRIC: Normal mood and affect. Normal behavior. Normal judgment and thought content. CARDIOVASCULAR: Normal heart rate noted RESPIRATORY: Effort normal, no problems with respiration noted. ABDOMEN: Soft, mildly distended. incision covered by dressing, no evidence of active bleeding, appropriately tender, no rebound or guarding.  PELVIC: deferred MUSCULOSKELETAL: Normal range of motion. No tenderness.  No cyanosis, clubbing, or edema.    UOP: 50 mL/hr, concentrated urine this am  Labs  Recent Labs  Lab 09/05/18 1659 09/05/18 2149 09/06/18 0432  WBC 19.6* 17.1* 16.1*  HGB 13.3 12.2 10.9*  HCT 40.9 35.9* 32.2*  PLT 131* 114* 120*    Assessment & Plan:   Patient is 33 y.o. [id number] POD#1 s/p EUA, D&C, SCH, BS, cystoscopy for retained products and endometritis. S/p c-section and bilateral tubal ligation on 08/11/18. She is doing remarkably well this am. H/H stable/appropriate, vital signs stable. She appears very fatigued but is appropriately answering questions and interacting. Reviewed course with patient, procedures done, she verbalizes understanding of the above. Will not further transfuse at this point as she has minimal bleeding, will give lasix as she has had significant fluid resuscitation. Urine noted to be bloody earlier this am, now appears concentrated, no acute concern for injury at this point.  Cont amp/gent/clinda for 48 hrs total (until afternoon 2/10) Advance to regular diet Remove arterial/central lines prior to transfer Pain meds prn Maintain foley for  now Lasix PO x 2 doses today  [facility] please call [phone] with any questions/issues (attending on call)   K. Therese SarahMeryl Sydnie Sigmund, M.D. Attending Center for [facility](Faculty Practice)

## 2018-09-07 ENCOUNTER — Encounter (HOSPITAL_COMMUNITY): Payer: Self-pay | Admitting: Certified Registered Nurse Anesthetist

## 2018-09-07 ENCOUNTER — Encounter (HOSPITAL_COMMUNITY): Payer: Self-pay | Admitting: Obstetrics and Gynecology

## 2018-09-07 LAB — MAGNESIUM: MAGNESIUM: 1.7 mg/dL (ref 1.7–2.4)

## 2018-09-07 NOTE — Progress Notes (Signed)
    Gynecology Progress Note  Admission Date: 09/05/2018 Current Date: 09/07/2018 1:20 PM  Ronica Mcfarlen is a 33 y.o. [id number] POD#2 s/p EUA, D&C, SCH, BS, cystoscopy for retained products and endometritis s/p repeat c-section.   History complicated by: Patient Active Problem List   Diagnosis Date Noted  . Status post surgery 09/05/2018  . Retained products of conception after delivery with complications 09/05/2018  . S/P C-section 09/05/2018    Subjective:  Patient feeling better this morning. She is tolerating food today. Ambulating around her room. Passing flatus. Pain moderately well controlled with PO pain meds. States vaginal bleeding is minimal.   Objective:   Patient Vitals for the past 24 hrs:  BP Temp Temp src Pulse Resp SpO2  09/07/18 1151 (!) 106/53 99.9 F (37.7 C) Oral (!) 108 16 -  09/07/18 0815 (!) 111/53 99.6 F (37.6 C) Oral (!) 103 18 -  09/07/18 0355 (!) 104/56 98.6 F (37 C) Oral 94 19 98 %  09/06/18 2329 113/71 98.7 F (37.1 C) Oral (!) 103 19 100 %  09/06/18 1929 117/62 (!) 100.5 F (38.1 C) Oral 93 19 100 %  09/06/18 1655 110/62 98.9 F (37.2 C) Oral 80 17 100 %  09/06/18 1600 - 99.2 F (37.3 C) Oral - - -  09/06/18 1500 113/65 - - 73 19 99 %  09/06/18 1400 (!) 106/59 - - 73 19 99 %   Total I/O In: 220 [P.O.:120; IV Piggyback:100] Out: 750 [Urine:750]  Intake/Output Summary (Last 24 hours) at 09/07/2018 1320 Last data filed at 09/07/2018 1136 Gross per 24 hour  Intake 1090 ml  Output 2125 ml  Net -1035 ml     Physical exam: BP (!) 106/53 (BP Location: Left Arm)   Pulse (!) 108   Temp 99.9 F (37.7 C) (Oral)   Resp 16   Wt 108 kg   SpO2 98%  CONSTITUTIONAL: Well-developed, well-nourished female in no acute distress. Appears fatigued. HENT:  Normocephalic, atraumatic,  SKIN: Skin is warm and dry. No rash noted. Not diaphoretic. No erythema. No pallor. NEUROLOGIC: Alert and oriented to person, place, and time. Normal reflexes, muscle  tone coordination. No cranial nerve deficit noted. PSYCHIATRIC: Normal mood and affect. Normal behavior. Normal judgment and thought content. CARDIOVASCULAR: Normal heart rate noted RESPIRATORY: Effort normal, no problems with respiration noted. ABDOMEN: Soft, mildly distended. incision covered by dressing, no evidence of active bleeding, appropriately tender, no rebound or guarding.  PELVIC: deferred MUSCULOSKELETAL: Normal range of motion. No tenderness.  No cyanosis, clubbing, or edema.     Labs  Recent Labs  Lab 09/05/18 1659 09/05/18 2149 09/06/18 0432  WBC 19.6* 17.1* 16.1*  HGB 13.3 12.2 10.9*  HCT 40.9 35.9* 32.2*  PLT 131* 114* 120*    Assessment & Plan:   Patient is 33 y.o. [id number] POD#2 s/p EUA, D&C, SCH, BS, cystoscopy for retained products and endometritis. S/p c-section and bilateral tubal ligation on 08/11/18. She is doing remarkably well today   Cont Amp/Gent/Clinda until afebrile for >24 hours (last fever was 1900 on 09/06/18) Regular diet as tolerated Pain meds as needed Encourage OOB, ambulation Routine postoperative care.   Jaynie Collins, MD, FACOG Obstetrician & Gynecologist, [facility] for [employer], [facility]

## 2018-09-08 ENCOUNTER — Encounter (HOSPITAL_COMMUNITY): Payer: Self-pay | Admitting: Obstetrics & Gynecology

## 2018-09-08 DIAGNOSIS — Z90711 Acquired absence of uterus with remaining cervical stump: Secondary | ICD-10-CM | POA: Diagnosis present

## 2018-09-08 DIAGNOSIS — D649 Anemia, unspecified: Secondary | ICD-10-CM | POA: Diagnosis not present

## 2018-09-08 LAB — CBC WITH DIFFERENTIAL/PLATELET
Basophils Absolute: 0 10*3/uL (ref 0.0–0.1)
Basophils Relative: 0 %
Eosinophils Absolute: 0.3 10*3/uL (ref 0.0–0.5)
Eosinophils Relative: 3 %
HCT: 21.5 % — ABNORMAL LOW (ref 36.0–46.0)
Hemoglobin: 6.8 g/dL — CL (ref 12.0–15.0)
Lymphocytes Relative: 36 %
Lymphs Abs: 3 10*3/uL (ref 0.7–4.0)
MCH: 28.8 pg (ref 26.0–34.0)
MCHC: 31.6 g/dL (ref 30.0–36.0)
MCV: 91.1 fL (ref 80.0–100.0)
MONOS PCT: 7 %
Monocytes Absolute: 0.6 10*3/uL (ref 0.1–1.0)
NEUTROS PCT: 54 %
Neutro Abs: 4.6 10*3/uL (ref 1.7–7.7)
Platelets: 121 10*3/uL — ABNORMAL LOW (ref 150–400)
RBC: 2.36 MIL/uL — ABNORMAL LOW (ref 3.87–5.11)
RDW: 15.4 % (ref 11.5–15.5)
WBC: 8.5 10*3/uL (ref 4.0–10.5)
nRBC: 0 % (ref 0.0–0.2)

## 2018-09-08 LAB — ABO/RH: ABO/RH(D): AB POS

## 2018-09-08 LAB — PREPARE RBC (CROSSMATCH)

## 2018-09-08 MED ORDER — SODIUM CHLORIDE 0.9% IV SOLUTION: Freq: Once | INTRAVENOUS | Status: AC

## 2018-09-08 MED ORDER — IBUPROFEN 600 MG PO TABS
600.0000 mg | ORAL_TABLET | Freq: Four times a day (QID) | ORAL | Status: DC | PRN
  Administered 2018-09-08 – 2018-09-09 (×2): 600 mg via ORAL
  Filled 2018-09-08 (×2): qty 1

## 2018-09-08 MED ORDER — DIPHENHYDRAMINE HCL 25 MG PO CAPS
25.0000 mg | ORAL_CAPSULE | Freq: Four times a day (QID) | ORAL | Status: DC | PRN
  Administered 2018-09-08: 25 mg via ORAL
  Filled 2018-09-08: qty 1

## 2018-09-08 MED ORDER — ACETAMINOPHEN 500 MG PO TABS
1000.0000 mg | ORAL_TABLET | Freq: Four times a day (QID) | ORAL | Status: DC | PRN
  Administered 2018-09-08: 1000 mg via ORAL
  Filled 2018-09-08: qty 2

## 2018-09-08 MED ORDER — SODIUM CHLORIDE 0.9% IV SOLUTION
Freq: Once | INTRAVENOUS | Status: AC
  Administered 2018-09-08: 09:00:00 via INTRAVENOUS

## 2018-09-08 NOTE — Progress Notes (Signed)
Gynecology Progress Note  Admission Date: 09/05/2018 Current Date: 09/08/2018 9:38 AM  Abigail Briggs is a 33 y.o. [id number] POD#3 s/p EUA, D&C, SCH, BS, [facility] for retained products and endometritis s/p RLTCS and BTL on 08[facility]11/18 [facility].   History complicated by: Patient Active Problem List   Diagnosis Date Noted  . Postoperative anemia 09/08/2018  . S/P abdominal supracervical subtotal hysterectomy 09/08/2018  . Retained products of conception after delivery with complications 09/05/2018  . S/P C-section 09/05/2018    Subjective:  Patient was noted to be dizzy this morning, no syncopal episode. Very tired.  CBC showed hemoglobin of 6.8 ( was 10.2 on 09/06/18). Scant vaginal bleeding. She is tolerating food today. Passing flatus. Pain moderately well controlled with PO pain meds.   Objective:   Patient Vitals for the past 24 hrs:  BP Temp Temp src Pulse Resp SpO2 Weight  09/08/18 0918 (!) 109/51 98.5 F (36.9 C) Oral 94 18 100 % -  09/08/18 0749 - - - - - - 108 kg  09/08/18 0739 (!) 109/54 98.4 F (36.9 C) Oral 95 18 100 % -  09/08/18 0300 111/64 99.6 F (37.6 C) Oral 99 18 99 % -  09/07/18 2314 (!) 94/54 99.6 F (37.6 C) Oral (!) 106 19 98 % -  09/07/18 1936 123/71 100.1 F (37.8 C) Oral (!) 118 19 100 % -  09/07/18 1639 117/63 98.9 F (37.2 C) Oral (!) 102 18 100 % -  09/07/18 1151 (!) 106/53 99.9 F (37.7 C) Oral (!) 108 16 - -   Total I/O In: -  Out: 400 [Urine:400]  Intake/Output Summary (Last 24 hours) at 09/08/2018 0938 Last data filed at 09/08/2018 0840 Gross per 24 hour  Intake 1000 ml  Output 3600 ml  Net -2600 ml     Physical exam: BP (!) 109/51 (BP Location: Right Arm)   Pulse 94   Temp 98.5 F (36.9 C) (Oral)   Resp 18   Wt 108 kg   SpO2 100%  CONSTITUTIONAL: Well-developed, well-nourished female in no acute distress. Appears fatigued. HENT:  Normocephalic, atraumatic,  SKIN: Skin is warm and dry. No rash  noted. Not diaphoretic. No erythema. Some pallor noted. NEUROLOGIC: Alert and oriented to person, place, and time. Normal reflexes, muscle tone coordination. No cranial nerve deficit noted. PSYCHIATRIC: Normal mood and affect. Normal behavior. Normal judgment and thought content. CARDIOVASCULAR: Normal heart rate noted RESPIRATORY: Effort normal, no problems with respiration noted. ABDOMEN: Soft, mildly distended. Incision covered by honeycomb dressing, no evidence of active bleeding, appropriately tender, no rebound or guarding.  PELVIC: deferred MUSCULOSKELETAL: Normal range of motion. No tenderness.  No cyanosis, clubbing, or edema.     Labs  Recent Labs  Lab 09/05/18 2149 09/06/18 0432 09/08/18 0534  WBC 17.1* 16.1* 8.5  HGB 12.2 10.9* 6.8*  HCT 35.9* 32.2* 21.5*  PLT 114* 120* 121*   CMP Latest Ref Rng & Units 09/06/2018 09/05/2018 09/05/2018  Glucose 70 - 99 mg/dL 454(U119(H) 981(X141(H) -  BUN 6 - 20 mg/dL 8 8 -  Creatinine 9.140.44 - 1.00 mg/dL 7.820.75 9.560.77 2.130.69  Sodium 135 - 145 mmol/L 141 140 -  Potassium 3.5 - 5.1 mmol/L 4.4 4.1 -  Chloride 98 - 111 mmol/L 109 111 -  CO2 22 - 32 mmol/L 23 21(L) -  Calcium 8.9 - 10.3 mg/dL 7.1(L) 6.8(L) -  Total Protein 6.5 - 8.1 g/dL - - -  Total Bilirubin 0.3 -  1.2 mg/dL - - -  Alkaline Phos 38 - 126 U/L - - -  AST 15 - 41 U/L - - -  ALT 0 - 44 U/L - - -    Assessment & Plan:   Patient is 33 y.o. [id number] POD#3 s/p EUA, D&C, SCH, BS, cystoscopy for retained products and endometritis. S/p c-section and bilateral tubal ligation on 08/11/18. Has postoperative symptomatic anemia today. No fevers for over 24 hours.  Will transfuse 3 units of pRBCs today, recheck CBC in the morning. Discontinue Amp/Gent/Clinda since afebrile for >24 hours (last fever was 1900 on 09/06/18). Monitor closely. Regular diet as tolerated Pain meds as needed Encourage OOB, ambulation with assistance given anemia symptoms Routine postoperative care Possible discharge  tomorrow   Abigail Collins, MD, FACOG Obstetrician & Gynecologist, [facility] for [employer], [facility]

## 2018-09-08 NOTE — Lactation Note (Signed)
Lactation Consultation Note  Patient Name: Kyi Arey RRNHA'F Date: 09/08/2018   [facility] Initial Visit:  Per MD note this mother had a hemorrhage after her C-section (with BTL) on 08/11/18 in [facility] and has since had retained products and suspected endometritis.  Another MD noted she had an emergent hysterectomy and significant blood loss requiring 8 units of PRBCs and 6 units FFP.  Mother resting in bed when I arrived.  Mother has a DEBP at bedside and has started pumping.  I explained the importance of pumping every three hours today due to her high blood loss.  Prior to her surgery she was pumping approximately 30 mls per breast per session.  I encouraged breast massage and hand expression before/after pumping.  Mother verbalized understanding.  Visitor in room.  Her breasts are soft and non tender and she denies pain with pumping.  She had no questions regarding pump or pump set up.  Asked her to call for any questions/concerns.                    Lynard Postlewait R Jerrit Horen 09/08/2018, 9:19 AM

## 2018-09-09 LAB — COMPREHENSIVE METABOLIC PANEL
ALT: 11 U/L (ref 0–44)
AST: 13 U/L — ABNORMAL LOW (ref 15–41)
Albumin: 2.6 g/dL — ABNORMAL LOW (ref 3.5–5.0)
Alkaline Phosphatase: 39 U/L (ref 38–126)
Anion gap: 6 (ref 5–15)
BUN: 6 mg/dL (ref 6–20)
CALCIUM: 7.9 mg/dL — AB (ref 8.9–10.3)
CO2: 26 mmol/L (ref 22–32)
Chloride: 105 mmol/L (ref 98–111)
Creatinine, Ser: 0.69 mg/dL (ref 0.44–1.00)
GFR calc non Af Amer: >60 mL/min (ref >60)
Glucose, Bld: 113 mg/dL — ABNORMAL HIGH (ref 70–99)
Potassium: 3.3 mmol/L — ABNORMAL LOW (ref 3.5–5.1)
Sodium: 137 mmol/L (ref 135–145)
Total Bilirubin: 0.6 mg/dL (ref 0.3–1.2)
Total Protein: 5.1 g/dL — ABNORMAL LOW (ref 6.5–8.1)

## 2018-09-09 LAB — TYPE AND SCREEN
ABO/RH(D): AB POS
Antibody Screen: NEGATIVE
UNIT DIVISION: 0
Unit division: 0
Unit division: 0

## 2018-09-09 LAB — BPAM RBC
Blood Product Expiration Date: 202002222359
Blood Product Expiration Date: 202002242359
Blood Product Expiration Date: 202002242359
ISSUE DATE / TIME: 202002110924
ISSUE DATE / TIME: 202002111130
ISSUE DATE / TIME: 202002111344
UNIT TYPE AND RH: 9500
Unit Type and Rh: 6200
Unit Type and Rh: 6200

## 2018-09-09 LAB — CBC
HCT: 30.3 % — ABNORMAL LOW (ref 36.0–46.0)
Hemoglobin: 10 g/dL — ABNORMAL LOW (ref 12.0–15.0)
MCH: 29.6 pg (ref 26.0–34.0)
MCHC: 33 g/dL (ref 30.0–36.0)
MCV: 89.6 fL (ref 80.0–100.0)
Platelets: 147 10*3/uL — ABNORMAL LOW (ref 150–400)
RBC: 3.38 MIL/uL — AB (ref 3.87–5.11)
RDW: 15.6 % — ABNORMAL HIGH (ref 11.5–15.5)
WBC: 11.2 10*3/uL — ABNORMAL HIGH (ref 4.0–10.5)
nRBC: 0 % (ref 0.0–0.2)

## 2018-09-09 LAB — MAGNESIUM: Magnesium: 1.6 mg/dL — ABNORMAL LOW (ref 1.7–2.4)

## 2018-09-09 MED ORDER — DOCUSATE SODIUM 100 MG PO CAPS: 100.0000 mg | ORAL_CAPSULE | Freq: Two times a day (BID) | ORAL | 1 refills | Status: DC | PRN

## 2018-09-09 MED ORDER — SIMETHICONE 80 MG PO CHEW: 80.0000 mg | CHEWABLE_TABLET | Freq: Four times a day (QID) | ORAL | 2 refills | Status: DC | PRN

## 2018-09-09 MED ORDER — IBUPROFEN 800 MG PO TABS: 800.0000 mg | ORAL_TABLET | Freq: Three times a day (TID) | ORAL | 1 refills | Status: DC | PRN

## 2018-09-09 MED ORDER — OXYCODONE HCL 5 MG PO TABS: 5.0000 mg | ORAL_TABLET | ORAL | 0 refills | Status: DC | PRN

## 2018-09-09 NOTE — Discharge Summary (Signed)
OB/GYN Physician Postoperative Discharge Summary  Patient ID: Abigail Briggs MRN: [id number] DOB/AGE: 32/11/1985 32 y.o.  Admit Date: 09/05/2018 Discharge Date: 09/09/2018  Preoperative Diagnoses: Retained products and suspected endometritis status post cesarean section on 08/11/18, massive hemorrhage, hemodynamic instability  Procedures: Procedure(s): Exam Under Anesthesia Dilatation And Curettage Abdominal supracervical hysterectomy with bilateral salpingectomy Cystoscopy  Hospital Course:  Abigail Briggs is a 33 y.o. [phone] who presented to [facility] ER via EMS with significant vaginal bleeding and syncopal symptoms. She was s/p cesarean section complicated by possible arterial injury needing transfusion, at [facility] on 08/11/18.  EMS reported SBP in 60s and significant amount of blood loss at home; IV fluids were pushed en route to ER. In the ER, lines were placed, pressors were started, and massive transfusion protocol was activated because of the bleeding.  OB/GYN on call was emergently called; Dr. Earlene Plateravis came and assessed the patient and decided that the patient needing emergent surgical evaluation and management.  In the OR, curettage was initially done for retained products bu despite several interventions, she continued to bleed severely.  The decision was made to proceed with hysterectomy. For further details of the operation, please refer to the operative report.   She received a total of 8 units pRBCs and 6 units FFP.  Blood loss in total was estimated to be between 5 & 6 L. She was admitted to the ICU after surgery.  Patient was noted to have some fevers and was treated with Ampicillin, Gentamicin and Clindamycin for 3 days; until she was afebrile for over 24 hours. She was [facility] to [facility] on POD#2 when it was felt she no longer needed ICU care. She progressed well but her hemoglbin on POD#3 was noted to be 6.8. No active bleeding was noted. She was also very  symptomatic.  She was given three additional units of blood, post transfusion hemoglobin was 10.2.  Her symptoms resolved.  By time of discharge on POD#4, her pain was controlled on oral pain medications; she was ambulating, voiding without difficulty, tolerating regular diet and passing flatus. She was deemed stable for discharge to home.   Significant Labs: CBC Latest Ref Rng & Units 09/09/2018 09/08/2018 09/06/2018  WBC 4.0 - 10.5 K/uL 11.2(H) 8.5 16.1(H)  Hemoglobin 12.0 - 15.0 g/dL 10.0(L) 6.8(LL) 10.9(L)  Hematocrit 36.0 - 46.0 % 30.3(L) 21.5(L) 32.2(L)  Platelets 150 - 400 K/uL 147(L) 121(L) 120(L)   CMP Latest Ref Rng & Units 09/09/2018 09/06/2018 09/05/2018  Glucose 70 - 99 mg/dL 875(I113(H) 433(I119(H) 951(O141(H)  BUN 6 - 20 mg/dL 6 8 8   Creatinine 0.44 - 1.00 mg/dL 8.410.69 6.600.75 6.300.77  Sodium 135 - 145 mmol/L 137 141 140  Potassium 3.5 - 5.1 mmol/L 3.3(L) 4.4 4.1  Chloride 98 - 111 mmol/L 105 109 111  CO2 22 - 32 mmol/L 26 23 21(L)  Calcium 8.9 - 10.3 mg/dL 7.9(L) 7.1(L) 6.8(L)  Total Protein 6.5 - 8.1 g/dL 5.1(L) - -  Total Bilirubin 0.3 - 1.2 mg/dL 0.6 - -  Alkaline Phos 38 - 126 U/L 39 - -  AST 15 - 41 U/L 13(L) - -  ALT 0 - 44 U/L 11 - -    Discharge Exam: Blood pressure 121/71, pulse 62, temperature 98.4 F (36.9 C), temperature source Oral, resp. rate 18, height 5\' 4"  (1.626 m), weight 108 kg, SpO2 99 %. General appearance: alert and no distress  Resp: clear to auscultation bilaterally  Cardio: regular rate and rhythm  GI: soft, non-tender;  bowel sounds normal; no masses, no organomegaly.  Incision: C/D/I, no erythema, no drainage noted Pelvic: scant blood on pad  Extremities: extremities normal, atraumatic, no cyanosis or edema and Homans sign is negative, no sign of DVT  Discharged Condition: Stable  Disposition: Discharge disposition: 01-Home or Self Care        Allergies as of 09/09/2018   No Known Allergies     Medication List    TAKE these medications   docusate  sodium 100 MG capsule Commonly known as:  COLACE Take 1 capsule (100 mg total) by mouth 2 (two) times daily as needed for mild constipation.   ibuprofen 800 MG tablet Commonly known as:  ADVIL,MOTRIN Take 1 tablet (800 mg total) by mouth 3 (three) times daily with meals as needed for fever, headache, moderate pain or cramping.   oxyCODONE 5 MG immediate release tablet Commonly known as:  Oxy IR/ROXICODONE Take 1 tablet (5 mg total) by mouth every 4 (four) hours as needed for severe pain or breakthrough pain.   prenatal multivitamin Tabs tablet Take 1 tablet by mouth daily at 12 noon.   simethicone 80 MG chewable tablet Commonly known as:  MYLICON Chew 1 tablet (80 mg total) by mouth 4 (four) times daily as needed (gas pain).      No future appointments. Follow-up Information    [facility] for [facility] Follow up in 2 week(s).   Specialty:  Obstetrics and Gynecology Why:  You will be contacted with appointment details Contact information: [address] [place] [address] [phone]          Signed:  Jaynie Collins, MD, FACOG Attending Obstetrician & Gynecologist Faculty Practice, [facility]

## 2018-09-09 NOTE — Progress Notes (Signed)
Discharge teaching complete with pt. Instructions understood and did not have any questions. Honeycomb removed per MD order.

## 2018-09-09 NOTE — Discharge Instructions (Signed)
Supracervical Hysterectomy, Care After This sheet gives you information about how to care for yourself after your procedure. Your health care provider may also give you more specific instructions. If you have problems or questions, contact your health care provider. What can I expect after the procedure? After the procedure, it is common to have some discomfort, tenderness, swelling, and bruising at the surgical area. This normally lasts for about 2 weeks. Follow these instructions at home: Medicines  Take over-the-counter and prescription medicines only as told by your health care provider.  Do not take aspirin. It can cause bleeding. Ask your health care provider when it is safe to use aspirin again.  Do not drive or use heavy machinery while taking prescription pain medicine.  To prevent or treat constipation while you are taking prescription pain medicine, your health care provider may recommend that you: ? Drink enough fluid to keep your urine pale yellow. ? Take over-the-counter or prescription medicines. ? Eat foods that are high in fiber, such as fresh fruits and vegetables, whole grains, and beans. ? Limit foods that are high in fat and processed sugars, such as fried and sweet foods. Activity  Get plenty of rest and sleep.  Try to have someone home with you for 1-2 weeks to help you with everyday chores.  Return to your normal activities as told by your health care provider. Ask your health care provider what activities are safe for you.  Do not lift anything that is heavier than 10 lb (4.5 kg) or the limit that your health care provider tells you until he or she says that it is safe.  Do not douche, use tampons, or have sex for at least 6 weeks or until your health care provider says it is safe to do so. Incision care   Follow instructions from your health care provider about how to take care of your incisions. Make sure you: ? Wash your hands with soap and water before  you change your bandage (dressing). If soap and water are not available, use hand sanitizer. ? Change your dressing as told by your health care provider. ? Leave stitches (sutures), skin glue, or adhesive strips in place. These skin closures may need to stay in place for 2 weeks or longer. If adhesive strip edges start to loosen and curl up, you may trim the loose edges. Do not remove adhesive strips completely unless your health care provider tells you to do that.  Check your incision area every day for signs of infection. Check for: ? Redness, swelling, or pain. ? Fluid or blood. ? Warmth. ? Pus or a bad smell.  Take showers instead of baths for 2-3 weeks or as told by your health care provider. Eating and drinking  Drink enough fluids to keep your urine clear or pale yellow.  Do not drink alcohol until your health care provider says it is okay to do so. General instructions  Monitor your temperature for as long as told by your health care provider.  Keep all follow-up visits as told by your health care provider. This is important. Contact a health care provider if:  You have redness, swelling, or pain around an incision.  You have chills or fever.  You have fluid or blood coming from an incision.  An incision feels warm to the touch.  You have pus or a bad smell coming from an incision.  Your incisions break open.  You feel dizzy or lightheaded.  You have pain or  bleeding when you urinate. °· You have diarrhea that does not go away. °· You have nausea and vomiting that do not go away. °· You have abnormal vaginal discharge. °· You have a rash. °· You have pain that does not go away when you take medicine. °Get help right away if: °· You have a fever and your symptoms suddenly get worse. °· You have severe abdominal pain. °· You have chest pain. °· You have shortness of breath. °· You faint. °· You have pain, swelling, or redness in your leg. °· You have heavy vaginal bleeding  with blood clots. °Summary °· After the procedure, it is common to have some discomfort, tenderness, swelling, and bruising at the surgical site. This normally lasts for about 2 weeks. °· Get help right away if you have excessive vaginal bleeding or severe abdominal pain. °This information is not intended to replace advice given to you by your health care provider. Make sure you discuss any questions you have with your health care provider. °Document Released: 05/05/2013 Document Revised: 10/10/2016 Document Reviewed: 10/10/2016 °Elsevier Interactive Patient Education © 2019 Elsevier Inc. ° °

## 2018-09-10 ENCOUNTER — Encounter: Payer: Self-pay | Admitting: Neurology

## 2018-09-28 ENCOUNTER — Other Ambulatory Visit: Payer: Self-pay

## 2018-09-28 ENCOUNTER — Ambulatory Visit (INDEPENDENT_AMBULATORY_CARE_PROVIDER_SITE_OTHER): Payer: Medicaid Other | Admitting: Obstetrics and Gynecology

## 2018-09-28 ENCOUNTER — Encounter: Payer: Self-pay | Admitting: Obstetrics and Gynecology

## 2018-09-28 ENCOUNTER — Other Ambulatory Visit (HOSPITAL_COMMUNITY)
Admission: RE | Admit: 2018-09-28 | Discharge: 2018-09-28 | Disposition: A | Discharge status: Home / Self Care | Payer: Medicaid Other | Source: Ambulatory Visit | Attending: Obstetrics and Gynecology | Admitting: Obstetrics and Gynecology

## 2018-09-28 DIAGNOSIS — Z9889 Other specified postprocedural states: Secondary | ICD-10-CM

## 2018-09-28 DIAGNOSIS — Z90710 Acquired absence of both cervix and uterus: Secondary | ICD-10-CM

## 2018-09-28 DIAGNOSIS — N898 Other specified noninflammatory disorders of vagina: Secondary | ICD-10-CM | POA: Diagnosis not present

## 2018-09-28 NOTE — Progress Notes (Signed)
GYNECOLOGY OFFICE FOLLOW UP NOTE  History:  33 y.o. [id number] here today for follow up for emergency supracervical hysterectomy on  09/05/18 after massive hemorrhage s/p CS 3 weeks earlier requiring transfusion of 8 units PRBCs, various other blood products. She is doing well today. Had bleeding 3-5 days after surgery, none since. Reports no nausea/vomiting. Eating normally, normal bowel/bladder habits. Reports she is mostly coping well, feels occasionally overwhelmed or periods of fatigue but is generally coping well. Denies anxiety, feeling overwhelmed. Breast and bottle feeding.   Past Medical History:  Diagnosis Date  . Headache   . Status post surgery 09/05/2018    Past Surgical History:  Procedure Laterality Date  . ABDOMINAL HYSTERECTOMY  09/05/2018   Procedure: Abdominal supracervical hysterectomy with bilateral salpingectomy;  Surgeon: Conan Bowens, MD;  Location: [facility] OR;  Service: Obstetrics;;  . BILATERAL SALPINGECTOMY  09/05/2018  . CESAREAN SECTION  2014  . CESAREAN SECTION    . CESAREAN SECTION W/BTL  08/11/2018  . CYSTOSCOPY  09/05/2018   Procedure: Cystoscopy;  Surgeon: Conan Bowens, MD;  [facility]: [facility] OR;  Service: Obstetrics;;  . DILATION AND CURETTAGE OF UTERUS  09/05/2018   Procedure: Dilatation And Curettage;  Surgeon: Conan Bowens, MD;  Location: [facility] OR;  Service: Obstetrics;;  . SUPRACERVICAL ABDOMINAL HYSTERECTOMY  09/05/2018     Current Outpatient Medications:  .  docusate sodium (COLACE) 100 MG capsule, Take 1 capsule (100 mg total) by mouth 2 (two) times daily as needed for mild constipation., Disp: 30 capsule, Rfl: 1 .  ibuprofen (ADVIL,MOTRIN) 800 MG tablet, Take 1 tablet (800 mg total) by mouth 3 (three) times daily with meals as needed for fever, headache, moderate pain or cramping., Disp: 30 tablet, Rfl: 1 .  naproxen (NAPROSYN) 375 MG tablet, Take 375 mg by mouth 2 (two) times daily with a meal., Disp: , Rfl:  .  oxyCODONE (OXY IR/ROXICODONE) 5 MG immediate  release tablet, Take 1 tablet (5 mg total) by mouth every 4 (four) hours as needed for severe pain or breakthrough pain., Disp: 30 tablet, Rfl: 0 .  Prenatal Vit-Fe Fumarate-FA (PRENATAL MULTIVITAMIN) TABS tablet, Take 1 tablet by mouth daily at 12 noon., Disp: , Rfl:  .  simethicone (MYLICON) 80 MG chewable tablet, Chew 1 tablet (80 mg total) by mouth 4 (four) times daily as needed (gas pain)., Disp: 30 tablet, Rfl: 2 .  Diclofenac Potassium 50 MG PACK, Take 50 mg by mouth once as needed. Take once daily as needed with headache onset. Please take with food (Patient not taking: Reported on 09/28/2018), Disp: 4 each, Rfl: 0 .  escitalopram (LEXAPRO) 10 MG tablet, Take 10 mg by mouth daily., Disp: , Rfl:  .  etonogestrel (NEXPLANON) 68 MG IMPL implant, 1 each by Subdermal route once., Disp: , Rfl:  .  SUMAtriptan (IMITREX) 100 MG tablet, Take 1 tablet (100 mg total) by mouth once as needed for migraine. May repeat in 2 hours if headache persists or recurs. (Patient not taking: Reported on 09/28/2018), Disp: 10 tablet, Rfl: 12  The following portions of the patient's history were reviewed and updated as appropriate: allergies, current medications, past family history, past medical history, past social history, past surgical history and problem list.   Review of Systems:  Pertinent items noted in HPI and remainder of comprehensive ROS otherwise negative.   Objective:  Physical Exam BP 118/84   Pulse 89   Ht 5\' 4"  (1.626 m)   Wt 216 lb 11.2 oz (  98.3 kg)   LMP 07/03/2015   BMI 37.20 kg/m  CONSTITUTIONAL: Well-developed, well-nourished female in no acute distress.  HENT:  Normocephalic, atraumatic. External right and left ear normal. Oropharynx is clear and moist EYES: Conjunctivae and EOM are normal. Pupils are equal, round, and reactive to light. No scleral icterus.  NECK: Normal range of motion, supple, no masses SKIN: Skin is warm and dry. No rash noted. Not diaphoretic. No erythema. No  pallor. NEUROLOGIC: Alert and oriented to person, place, and time. Normal reflexes, muscle tone coordination. No cranial nerve deficit noted. PSYCHIATRIC: Normal mood and affect. Normal behavior. Normal judgment and thought content. CARDIOVASCULAR: Normal heart rate noted RESPIRATORY: Effort normal, no problems with respiration noted ABDOMEN: Soft, no distention noted. Healing pfannenstiel with some moist areas, no signs of infection PELVIC: Normal appearing external genitalia; normal appearing vaginal mucosa and cervix.  Thick white discharge present. MUSCULOSKELETAL: Normal range of motion. No edema noted.  Labs and Imaging Diagnosis 1. 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 - NO RESIDUAL CHORIONIC VILLI IDENTIFIED. - DETACHED PORTIONS OF UNREMARKABLE ENDOCERVICAL-TYPE TISSUE. 2. Fallopian tube, bilateral - NO SIGNIFICANT HISTOPATHOLOGIC FINDINGS. 3. 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 - NO RESIDUAL CHORIONIC VILLI IDENTIFIED. Microscopic Comment 1. Intradepartmental consultation was obtained (Dr. Luisa Hart - 1, 3). Abigail Briggs  Assessment & Plan:  1. Postpartum state Doing well, some blues but reports she is coping well  2. Post-operative state Recommended gauze at incision site to help keep dry, otherwise, appears to be healing well  3. S/P hysterectomy - Reviewed course with patient, steps from ED to OR and post operative course. Answered all questions, reviewed expected course and follow up.  - Doing well, no issues, benign path  4. Discharge from the vagina - Cervicovaginal ancillary only( [place])   Routine preventative health maintenance measures emphasized. Please refer to After Visit Summary for other counseling recommendations.   Return in about 4 weeks (around 10/26/2018) for Followup.  Total face-to-face time with patient: 25 minutes. Over 50%  of encounter was spent on counseling and coordination of care.  Baldemar Lenis, M.D. Attending Center for [employer] [facility])

## 2018-09-30 ENCOUNTER — Encounter: Payer: Self-pay | Admitting: Obstetrics and Gynecology

## 2018-10-01 ENCOUNTER — Encounter: Payer: Self-pay | Admitting: Obstetrics and Gynecology

## 2018-10-01 LAB — CERVICOVAGINAL ANCILLARY ONLY
BACTERIAL VAGINITIS: NEGATIVE
CANDIDA VAGINITIS: NEGATIVE
CHLAMYDIA, DNA PROBE: NEGATIVE
NEISSERIA GONORRHEA: NEGATIVE
Trichomonas: NEGATIVE

## 2018-10-26 ENCOUNTER — Ambulatory Visit (INDEPENDENT_AMBULATORY_CARE_PROVIDER_SITE_OTHER): Payer: Medicaid Other | Admitting: Obstetrics and Gynecology

## 2018-10-26 ENCOUNTER — Encounter: Payer: Self-pay | Admitting: Obstetrics and Gynecology

## 2018-10-26 ENCOUNTER — Other Ambulatory Visit: Payer: Self-pay

## 2018-10-26 DIAGNOSIS — Z90710 Acquired absence of both cervix and uterus: Secondary | ICD-10-CM

## 2018-10-26 DIAGNOSIS — Z98891 History of uterine scar from previous surgery: Secondary | ICD-10-CM

## 2018-10-26 NOTE — Progress Notes (Signed)
   TELEHEALTH VIRTUAL GYNECOLOGY VISIT ENCOUNTER NOTE  I connected with Abigail Briggs on 10/26/18 at  3:55 PM EDT by telephone at home and verified that I am speaking with the correct person using two identifiers.   I discussed the limitations, risks, security and privacy concerns of performing an evaluation and management service by telephone and the availability of in person appointments. I also discussed with the patient that there may be a patient responsible charge related to this service. The patient expressed understanding and agreed to proceed.   History:  Abigail Briggs is a 33 y.o. [phone] female being evaluated today for post op hysterectomy visit. S/p emergency hysterectomy 09/05/18 after c-section. Hasn't had pain in several weeks. No issues with nausea/vomiting, bowel/bladder habits. No bleeding. Reports incision site looks "good", all sutures are good, denies pain, oozing, redness.    Past Medical History:  Diagnosis Date  . Headache   . Status post surgery 09/05/2018   Past Surgical History:  Procedure Laterality Date  . ABDOMINAL HYSTERECTOMY  09/05/2018   Procedure: Abdominal supracervical hysterectomy with bilateral salpingectomy;  Surgeon: Conan Bowens, MD;  Location: [facility] OR;  Service: Obstetrics;;  . BILATERAL SALPINGECTOMY  09/05/2018  . CESAREAN SECTION  2014  . CESAREAN SECTION    . CESAREAN SECTION W/BTL  08/11/2018  . CYSTOSCOPY  09/05/2018   Procedure: Cystoscopy;  Surgeon: Conan Bowens, MD;  Location: [facility] OR;  Service: Obstetrics;;  . DILATION AND CURETTAGE OF UTERUS  09/05/2018   Procedure: Dilatation And Curettage;  Surgeon: Conan Bowens, MD;  Location: [facility] OR;  Service: Obstetrics;;  . SUPRACERVICAL ABDOMINAL HYSTERECTOMY  09/05/2018   The following portions of the patient's history were reviewed and updated as appropriate: allergies, current medications, past family history, past medical history, past social history, past surgical history and problem list.     Review of Systems:  Pertinent items noted in HPI and remainder of comprehensive ROS otherwise negative.  Physical Exam:  Physical exam deferred due to nature of the encounter  Labs and Imaging No results found for this or any previous visit (from the past 336 hour(s)). No results found.    Assessment and Plan:   1. S/P hysterectomy Doing well, no issues Okay to return to work Letter given for lifting/breaks at work [employer] for intercourse  2. S/P C-section Doing well   Return 3 months for annual, call if any issues before then    I discussed the assessment and treatment plan with the patient. The patient was provided an opportunity to ask questions and all were answered. The patient agreed with the plan and demonstrated an understanding of the instructions.   The patient was advised to call back or seek an in-person evaluation/go to the ED if the symptoms worsen or if the condition fails to improve as anticipated.  I provided 15 minutes of non-face-to-face time during this encounter.   Conan Bowens, MD Center for [facility], [facility]

## 2018-10-27 ENCOUNTER — Telehealth: Payer: Self-pay | Admitting: Family Medicine

## 2018-10-27 NOTE — Telephone Encounter (Signed)
The patient called in to request her doctors note be faxed to the employer and the original note be mailed to her.

## 2019-06-13 ENCOUNTER — Other Ambulatory Visit: Payer: Self-pay

## 2019-06-13 ENCOUNTER — Ambulatory Visit (HOSPITAL_COMMUNITY)
Admission: EM | Admit: 2019-06-13 | Discharge: 2019-06-13 | Disposition: A | Discharge status: Home / Self Care | Payer: Medicaid Other | Attending: Emergency Medicine | Admitting: Emergency Medicine

## 2019-06-13 ENCOUNTER — Encounter (HOSPITAL_COMMUNITY): Payer: Self-pay

## 2019-06-13 DIAGNOSIS — J020 Streptococcal pharyngitis: Secondary | ICD-10-CM

## 2019-06-13 LAB — POCT RAPID STREP A: Streptococcus, Group A Screen (Direct): POSITIVE — AB

## 2019-06-13 MED ORDER — AMOXICILLIN 500 MG PO CAPS: 500.0000 mg | ORAL_CAPSULE | Freq: Two times a day (BID) | ORAL | 0 refills | Status: AC

## 2019-06-13 MED ORDER — FLUCONAZOLE 150 MG PO TABS
150.0000 mg | ORAL_TABLET | Freq: Once | ORAL | 0 refills | Status: AC
Start: 2019-06-13 — End: 2019-06-13

## 2019-06-13 NOTE — ED Triage Notes (Signed)
Pt. States since Friday she developed a sore throat, nasal congestion. Does not what caused it.

## 2019-06-13 NOTE — Discharge Instructions (Addendum)
Complete all medication as prescribed.   

## 2019-06-13 NOTE — ED Provider Notes (Signed)
[facility]    CSN: [id number] Arrival date & time: 06/13/19  1457      History   Chief Complaint Chief Complaint  Patient presents with  . Sore Throat    HPI Abigail Briggs is a 33 y.o. female.   HPI  SORE THROAT Onset: 2-3 days, Severity: moderate, hx of streptococcal infection. Afebrile. No known sick or COVID exposures. Has six children, all are known to be well. Tried OTC meds without significant relief.  Symptoms:   Swollen neck glands, pain with and without swallowing, decrease appetite, mild nausea. No headache, bodyaches, rash, or fever.   Past Medical History:  Diagnosis Date  . Headache   . Status post surgery 09/05/2018    Patient Active Problem List   Diagnosis Date Noted  . Postoperative anemia 09/08/2018  . S/P abdominal supracervical subtotal hysterectomy 09/08/2018  . Retained products of conception after delivery with complications 09/05/2018  . S/P C-section 09/05/2018  . TIA (transient ischemic attack) 08/02/2015  . Complicated migraine 08/02/2015  . Numbness and tingling of left arm and leg 08/02/2015    Past Surgical History:  Procedure Laterality Date  . ABDOMINAL HYSTERECTOMY  09/05/2018   Procedure: Abdominal supracervical hysterectomy with bilateral salpingectomy;  Surgeon: Conan Bowens, MD;  Location: [facility] OR;  Service: Obstetrics;;  . BILATERAL SALPINGECTOMY  09/05/2018  . CESAREAN SECTION  2014  . CESAREAN SECTION    . CESAREAN SECTION W/BTL  08/11/2018  . CYSTOSCOPY  09/05/2018   Procedure: Cystoscopy;  Surgeon: Conan Bowens, MD;  Location: [facility] OR;  Service: Obstetrics;;  . DILATION AND CURETTAGE OF UTERUS  09/05/2018   Procedure: Dilatation And Curettage;  Surgeon: Conan Bowens, MD;  Location: [facility] OR;  Service: Obstetrics;;  . SUPRACERVICAL ABDOMINAL HYSTERECTOMY  09/05/2018    OB History    Gravida  6   Para  6   Term  6   Preterm  0   AB  0   Living  6     SAB  0   TAB  0   Ectopic  0   Multiple    Live Births  6            Home Medications    Prior to Admission medications   Medication Sig Start Date End Date Taking? Authorizing Provider  Diclofenac Potassium 50 MG PACK Take 50 mg by mouth once as needed. Take once daily as needed with headache onset. Please take with food Patient not taking: Reported on 09/28/2018 09/12/15   Anson Fret, MD  docusate sodium (COLACE) 100 MG capsule Take 1 capsule (100 mg total) by mouth 2 (two) times daily as needed for mild constipation. Patient not taking: Reported on 10/26/2018 09/09/18   Anyanwu, Jethro Bastos, MD  escitalopram (LEXAPRO) 10 MG tablet Take 10 mg by mouth daily.    [provider]  etonogestrel (NEXPLANON) 68 MG IMPL implant 1 each by Subdermal route once.    [provider]  ibuprofen (ADVIL,MOTRIN) 800 MG tablet Take 1 tablet (800 mg total) by mouth 3 (three) times daily with meals as needed for fever, headache, moderate pain or cramping. Patient not taking: Reported on 10/26/2018 09/09/18   Anyanwu, Jethro Bastos, MD  naproxen (NAPROSYN) 375 MG tablet Take 375 mg by mouth 2 (two) times daily with a meal.    [provider]  oxyCODONE (OXY IR/ROXICODONE) 5 MG immediate release tablet Take 1 tablet (5 mg total) by mouth every 4 (four)  hours as needed for severe pain or breakthrough pain. Patient not taking: Reported on 10/26/2018 09/09/18   Tereso NewcomerAnyanwu, Ugonna A, MD  Prenatal Vit-Fe Fumarate-FA (PRENATAL MULTIVITAMIN) TABS tablet Take 1 tablet by mouth daily at 12 noon.    [provider]  simethicone (MYLICON) 80 MG chewable tablet Chew 1 tablet (80 mg total) by mouth 4 (four) times daily as needed (gas pain). Patient not taking: Reported on 10/26/2018 09/09/18   Anyanwu, Jethro BastosUgonna A, MD  SUMAtriptan (IMITREX) 100 MG tablet Take 1 tablet (100 mg total) by mouth once as needed for migraine. May repeat in 2 hours if headache persists or recurs. Patient not taking: Reported on 09/28/2018 09/12/15   Anson FretAhern, Antonia B, MD     Family History Family History  Problem Relation Age of Onset  . Cancer Mother   . Healthy Father   . Migraines Neg Hx   . Stroke Neg Hx     Social History Social History   Tobacco Use  . Smoking status: Never Smoker  . Smokeless tobacco: Never Used  Substance Use Topics  . Alcohol use: Not Currently    Comment: occ  . Drug use: Never     Allergies   Patient has no known allergies.   Review of Systems Review of Systems Pertinent negatives listed in HPI  Physical Exam Triage Vital Signs ED Triage Vitals  Enc Vitals Group     BP 06/13/19 1531 (!) 143/85     Pulse Rate 06/13/19 1531 90     Resp 06/13/19 1531 18     Temp --      Temp src --      SpO2 06/13/19 1531 99 %     Weight 06/13/19 1534 215 lb (97.5 kg)     Height --      Head Circumference --      Peak Flow --      Pain Score 06/13/19 1534 4     Pain Loc --      Pain Edu? --      Excl. in GC? --    No data found.  Updated Vital Signs BP (!) 143/85 (BP Location: Right Arm)   Pulse 90   Resp 18   Wt 215 lb (97.5 kg)   LMP 07/03/2015   SpO2 99%   BMI 36.90 kg/m   Visual Acuity Right Eye Distance:   Left Eye Distance:   Bilateral Distance:    Right Eye Near:   Left Eye Near:    Bilateral Near:     Physical Exam General appearance: alert, well developed, well nourished, cooperative and in no distress Head: Normocephalic, without obvious abnormality, atraumatic ENT: swollen glands, erythematous posterior pharynx  Respiratory: Respirations even and unlabored, normal respiratory rate Heart: rate and rhythm normal. No gallop or murmurs noted on exam  Abdomen: BS +, no distention, no rebound tenderness, or no mass Extremities: No gross deformities Skin: Skin color, texture, turgor normal. No rashes seen  Psych: Appropriate mood and affect. Neurologic: Mental status: Alert, oriented to person, place, and time, thought content appropriate.  UC Treatments / Results  Labs (all labs ordered  are listed, but only abnormal results are displayed) Labs Reviewed  POCT RAPID STREP A    EKG   Radiology No results found.  Procedures Procedures (including critical care time)  Medications Ordered in UC Medications - No data to display  Initial Impression / Assessment and Plan / UC Course  I have reviewed the triage vital  signs and the nursing notes.  Pertinent labs & imaging results that were available during my care of the patient were reviewed by me and considered in my medical decision making (see chart for details).   Rapid strep positive. Empirically treat with amoxicillin 500 mg BID x 10 days. Return precautions given if symptoms worsen or do not improve.   Final Clinical Impressions(s) / UC Diagnoses   Final diagnoses:  Strep pharyngitis     Discharge Instructions     Complete all medication as prescribed.     ED Prescriptions    Medication Sig Dispense Auth. Provider   amoxicillin (AMOXIL) 500 MG capsule Take 1 capsule (500 mg total) by mouth 2 (two) times daily for 10 days. 20 capsule Scot Jun, FNP   fluconazole (DIFLUCAN) 150 MG tablet Take 1 tablet (150 mg total) by mouth once for 1 dose. Start if vaginal irritation occurs. 1 tablet Scot Jun, FNP     PDMP not reviewed this encounter.   Scot Jun, [place] 06/14/19 2144

## 2019-09-11 IMAGING — DX DG CHEST PORT 1 VIEW
1 series · 1 of 1 positions shown · non-contrast
Comparison: None.

CLINICAL DATA: Acute respiratory failure.

EXAM:
PORTABLE CHEST 1 VIEW

[chest]
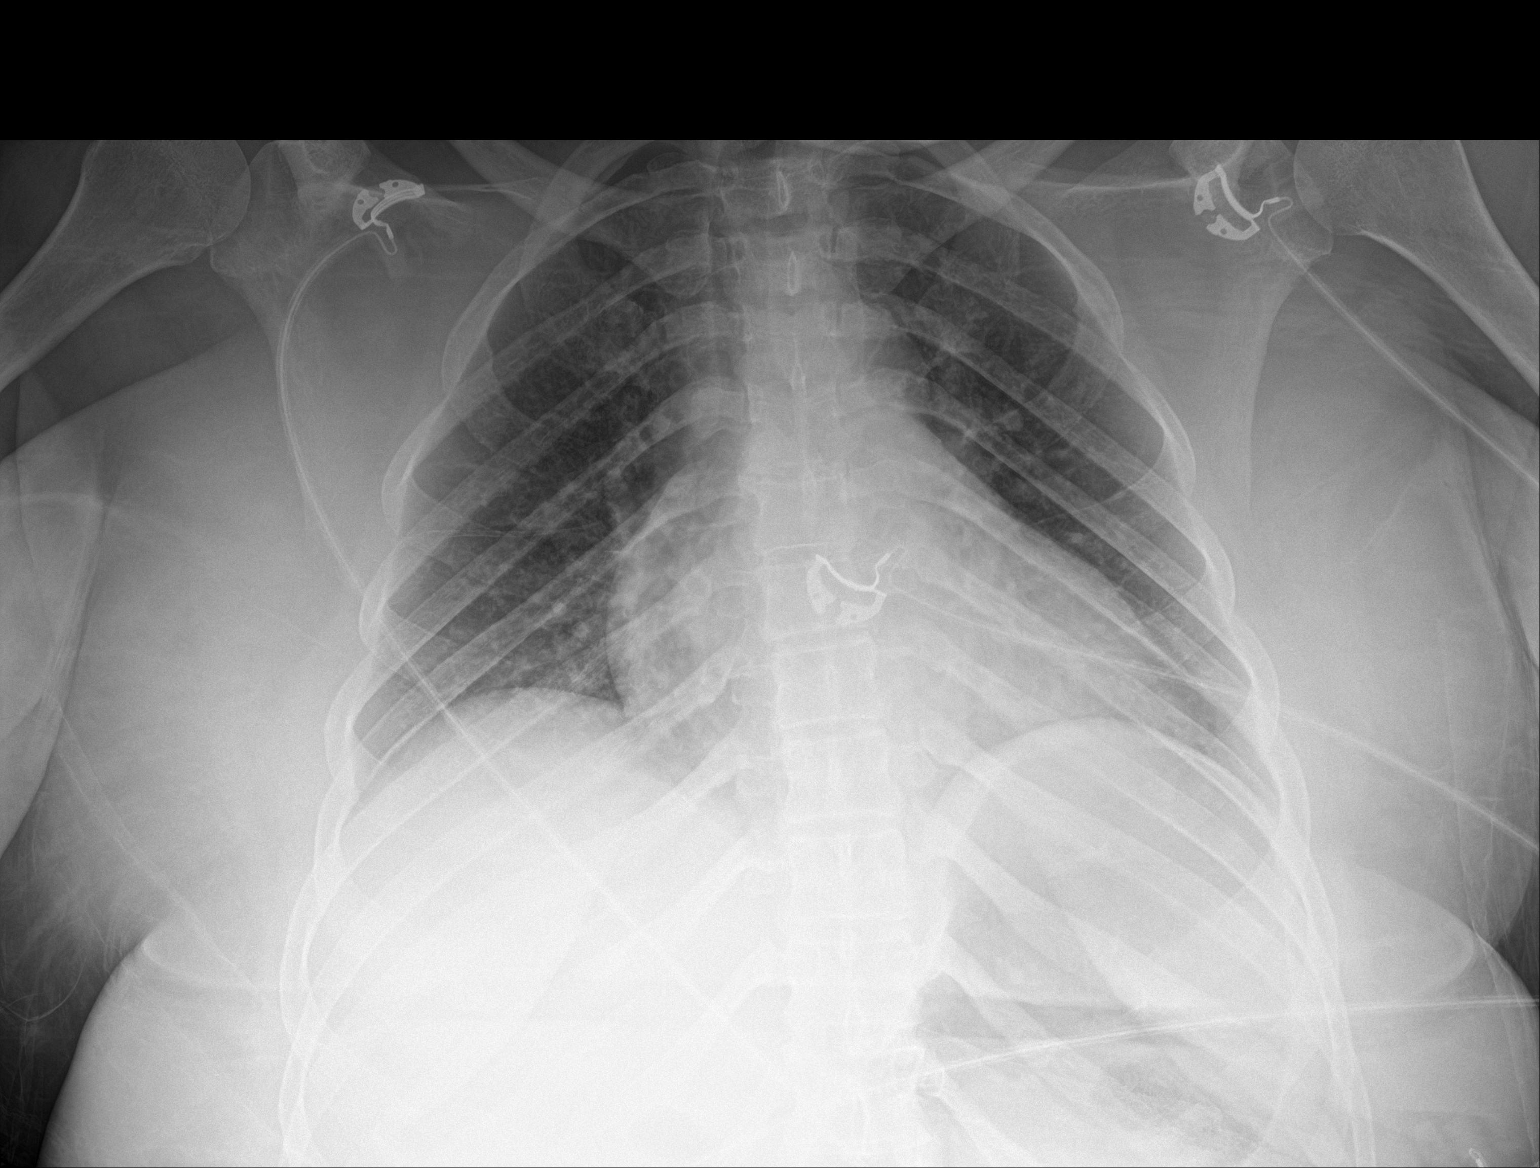

[1 of 1 positions shown; findings below may reference images not displayed]

FINDINGS: The heart is upper limits of normal given the AP projection and
portable technique. The mediastinal and hilar contours are within
normal limits. Low lung volumes with vascular crowding and streaky
basilar atelectasis but no infiltrates, edema or effusions. Moderate
gaseous distention of the stomach. The bony thorax is intact.
IMPRESSION: Low lung volumes with streaky basilar atelectasis but no
infiltrates, edema or effusions.

## 2020-12-18 ENCOUNTER — Ambulatory Visit (HOSPITAL_COMMUNITY)
Admission: EM | Admit: 2020-12-18 | Discharge: 2020-12-18 | Disposition: A | Discharge status: Home / Self Care | Payer: Medicaid Other | Attending: Emergency Medicine | Admitting: Emergency Medicine

## 2020-12-18 ENCOUNTER — Other Ambulatory Visit: Payer: Self-pay

## 2020-12-18 ENCOUNTER — Encounter (HOSPITAL_COMMUNITY): Payer: Self-pay

## 2020-12-18 DIAGNOSIS — Z20822 Contact with and (suspected) exposure to covid-19: Secondary | ICD-10-CM | POA: Diagnosis not present

## 2020-12-18 DIAGNOSIS — B349 Viral infection, unspecified: Secondary | ICD-10-CM | POA: Diagnosis present

## 2020-12-18 LAB — SARS CORONAVIRUS 2 (TAT 6-24 HRS): SARS Coronavirus 2: NEGATIVE

## 2020-12-18 MED ORDER — PSEUDOEPH-BROMPHEN-DM 30-2-10 MG/5ML PO SYRP: 10.0000 mL | ORAL_SOLUTION | Freq: Four times a day (QID) | ORAL | 0 refills | Status: DC | PRN

## 2020-12-18 MED ORDER — LIDOCAINE VISCOUS HCL 2 % MT SOLN: 10.0000 mL | OROMUCOSAL | 0 refills | Status: DC | PRN

## 2020-12-18 MED ORDER — FLUTICASONE PROPIONATE 50 MCG/ACT NA SUSP: 1.0000 | Freq: Two times a day (BID) | NASAL | 0 refills | Status: AC

## 2020-12-18 NOTE — ED Provider Notes (Signed)
[name]  ____________________________________________  Time seen: Approximately 11:51 AM  I have reviewed the triage vital signs and the nursing notes.   HISTORY  Chief Complaint Sore Throat and URI    HPI Abigail Briggs is a 35 y.o. female who presents the emergency department with 3-day history of headache, nasal congestion, sore throat, cough.  Patient states that her husband just got over a round of COVID, she is got 6 kids at home and multiple home are also showing symptoms.  Patient developed symptoms 3 days ago, has taken 2 rapid COVID test which were negative.  She denies any visual changes, neck pain or stiffness, chest pain, frank shortness of breath, abdominal pain, nausea vomiting.  Patient states that she is primarily here for COVID testing and a note for work.  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         Past Medical History:  Diagnosis Date  . Headache   . Status post surgery 09/05/2018    Patient Active Problem List   Diagnosis Date Noted  . Postoperative anemia 09/08/2018  . S/P abdominal supracervical subtotal hysterectomy 09/08/2018  . Retained products of conception after delivery with complications 09/05/2018  . S/P C-section 09/05/2018  . TIA (transient ischemic attack) 08/02/2015  . Complicated migraine 08/02/2015  . Numbness and tingling of left arm and leg 08/02/2015    Past Surgical History:  Procedure Laterality Date  . ABDOMINAL HYSTERECTOMY  09/05/2018   Procedure: Abdominal supracervical hysterectomy with bilateral salpingectomy;  Surgeon: Conan Bowens, MD;  Location: [facility] OR;  Service: Obstetrics;;  . BILATERAL SALPINGECTOMY  09/05/2018  . CESAREAN SECTION  2014  . CESAREAN SECTION    . CESAREAN SECTION W/BTL  08/11/2018  . CYSTOSCOPY  09/05/2018   Procedure: Cystoscopy;  Surgeon: Conan Bowens, MD;  Location: [facility] OR;  Service: Obstetrics;;  .  DILATION AND CURETTAGE OF UTERUS  09/05/2018   Procedure: Dilatation And Curettage;  Surgeon: Conan Bowens, MD;  Location: [facility] OR;  Service: Obstetrics;;  . SUPRACERVICAL ABDOMINAL HYSTERECTOMY  09/05/2018    Prior to Admission medications   Medication Sig Start Date End Date Taking? Authorizing Provider  brompheniramine-pseudoephedrine-DM 30-2-10 MG/5ML syrup Take 10 mLs by mouth 4 (four) times daily as needed. 12/18/20  Yes Loveta Dellis, Delorise Royals, PA-C  fluticasone (FLONASE) 50 MCG/ACT nasal spray Place 1 spray into both nostrils 2 (two) times daily. 12/18/20  Yes Kia Stavros, Delorise Royals, PA-C  lidocaine (XYLOCAINE) 2 % solution Use as directed 10 mLs in the mouth or throat every 4 (four) hours as needed (sore throat). 12/18/20  Yes Zamya Culhane, Delorise Royals, PA-C  Diclofenac Potassium 50 MG PACK Take 50 mg by mouth once as needed. Take once daily as needed with headache onset. Please take with food Patient not taking: Reported on 09/28/2018 09/12/15   Anson Fret, MD  docusate sodium (COLACE) 100 MG capsule Take 1 capsule (100 mg total) by mouth 2 (two) times daily as needed for mild constipation. Patient not taking: Reported on 10/26/2018 09/09/18   Anyanwu, Jethro Bastos, MD  escitalopram (LEXAPRO) 10 MG tablet Take 10 mg by mouth daily.    [provider]  etonogestrel (NEXPLANON) 68 MG IMPL implant 1 each by Subdermal route once.    [provider]  ibuprofen (ADVIL,MOTRIN) 800 MG tablet Take 1 tablet (800 mg total) by mouth 3 (three) times daily with  meals as needed for fever, headache, moderate pain or cramping. Patient not taking: Reported on 10/26/2018 09/09/18   Anyanwu, Jethro Bastos, MD  naproxen (NAPROSYN) 375 MG tablet Take 375 mg by mouth 2 (two) times daily with a meal.    [provider]  oxyCODONE (OXY IR/ROXICODONE) 5 MG immediate release tablet Take 1 tablet (5 mg total) by mouth every 4 (four) hours as needed for severe pain or breakthrough pain. Patient not taking:  Reported on 10/26/2018 09/09/18   Tereso Newcomer, MD  Prenatal Vit-Fe Fumarate-FA (PRENATAL MULTIVITAMIN) TABS tablet Take 1 tablet by mouth daily at 12 noon.    [provider]  simethicone (MYLICON) 80 MG chewable tablet Chew 1 tablet (80 mg total) by mouth 4 (four) times daily as needed (gas pain). Patient not taking: Reported on 10/26/2018 09/09/18   Anyanwu, Jethro Bastos, MD  SUMAtriptan (IMITREX) 100 MG tablet Take 1 tablet (100 mg total) by mouth once as needed for migraine. May repeat in 2 hours if headache persists or recurs. Patient not taking: Reported on 09/28/2018 09/12/15   Anson Fret, MD    Allergies Patient has no known allergies.  Family History  Problem Relation Age of Onset  . Cancer Mother   . Healthy Father   . Migraines Neg Hx   . Stroke Neg Hx     Social History Social History   Tobacco Use  . Smoking status: Never Smoker  . Smokeless tobacco: Never Used  Substance Use Topics  . Alcohol use: Not Currently    Comment: occ  . Drug use: Never     Review of Systems  Constitutional: No fever/chills Eyes: No visual changes. No discharge ENT: Positive for nasal congestion and sore throat Cardiovascular: no chest pain. Respiratory: Positive cough. No SOB. Gastrointestinal: No abdominal pain.  No nausea, no vomiting.  No diarrhea.  No constipation. Musculoskeletal: Negative for musculoskeletal pain. Skin: Negative for rash, abrasions, lacerations, ecchymosis. Neurological: Positive for headache but denies focal weakness or numbness.  10 System ROS otherwise negative.  ____________________________________________   PHYSICAL EXAM:  VITAL SIGNS: ED Triage Vitals [12/18/20 1108]  Enc Vitals Group     BP 112/67     Pulse Rate 83     Resp 17     Temp 98.7 F (37.1 C)     Temp Source Oral     SpO2 100 %     Weight      Height      Head Circumference      Peak Flow      Pain Score 6     Pain Loc      Pain Edu?      Excl. in GC?       Constitutional: Alert and oriented. Well appearing and in no acute distress. Eyes: Conjunctivae are normal. PERRL. EOMI. Head: Atraumatic. ENT:      Ears: EACs and TMs unremarkable bilaterally      Nose: Mild clear congestion/rhinnorhea.      Mouth/Throat: Mucous membranes are moist.  Oropharynx is slightly erythematous but nonedematous.  No exudates.  Uvula is midline Neck: No stridor.  Neck is supple full range of motion Hematological/Lymphatic/Immunilogical: No cervical lymphadenopathy. Cardiovascular: Normal rate, regular rhythm. Normal S1 and S2.  Good peripheral circulation. Respiratory: Normal respiratory effort without tachypnea or retractions. Lungs CTAB. Good air entry to the bases with no decreased or absent breath sounds. Musculoskeletal: Full range of motion to all extremities. No gross deformities appreciated. Neurologic:  Normal speech and language. No gross focal neurologic deficits are appreciated.  Skin:  Skin is warm, dry and intact. No rash noted. Psychiatric: Mood and affect are normal. Speech and behavior are normal. Patient exhibits appropriate insight and judgement.   ____________________________________________   LABS (all labs ordered are listed, but only abnormal results are displayed)  Labs Reviewed  SARS CORONAVIRUS 2 (TAT 6-24 HRS)   ____________________________________________  EKG   ____________________________________________  RADIOLOGY   No results found.  ____________________________________________    PROCEDURES  Procedure(s) performed:    Procedures    Medications - No data to display   ____________________________________________   INITIAL IMPRESSION / ASSESSMENT AND PLAN / ED COURSE  Pertinent labs & imaging results that were available during my care of the patient were reviewed by me and considered in my medical decision making (see chart for details).  Review of the [place] CSRS was performed in accordance of the  [facility] prior to dispensing any controlled drugs.           Patient's diagnosis is consistent with viral illness.  Patient presented to the emergency department with multiple URI symptoms.  She has been closely exposed to an individual with COVID, multiple family members are sick at home with similar symptoms.  Patient has taken with a rapid COVID test x2 with both results being negative.  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  She is here primarily for repeat testing and note for work.  Patient will have COVID test, follow results in my chart.  Patient will have prescription for symptom control meds.  Follow-up with primary care as needed..  Patient is given precautions to return to the Urgent Care or the ED for any worsening or new symptoms.     ____________________________________________  FINAL CLINICAL IMPRESSION(S) / DIAGNOSES  Final diagnoses:  Viral illness      NEW MEDICATIONS STARTED DURING THIS VISIT:  ED Discharge Orders         Ordered    brompheniramine-pseudoephedrine-DM 30-2-10 MG/5ML syrup  4 times daily PRN        12/18/20 1156    fluticasone (FLONASE) 50 MCG/ACT nasal spray  2 times daily        12/18/20 1156    lidocaine (XYLOCAINE) 2 % solution  Every 4 hours PRN        12/18/20 1156              This chart was dictated using voice recognition software/Dragon. Despite best efforts to proofread, errors can occur which can change the meaning. Any change was purely unintentional.    Racheal Patches, PA-C 12/18/20 1156

## 2020-12-18 NOTE — ED Triage Notes (Signed)
Pt presents with sore throat, sinus congestion, and headache X 3 days.

## 2021-09-24 ENCOUNTER — Ambulatory Visit (HOSPITAL_COMMUNITY)
Admission: EM | Admit: 2021-09-24 | Discharge: 2021-09-24 | Discharge status: Against Medical Advice | Payer: Medicaid Other

## 2021-09-24 NOTE — ED Notes (Signed)
Called from waiting room, no answer

## 2021-09-24 NOTE — ED Notes (Signed)
Attempt to call patient from lobby and phone, no response.

## 2022-10-07 ENCOUNTER — Emergency Department (HOSPITAL_COMMUNITY)
Admission: EM | Admit: 2022-10-07 | Discharge: 2022-10-07 | Disposition: A | Discharge status: Home / Self Care | Payer: Medicaid Other | Attending: Emergency Medicine | Admitting: Emergency Medicine

## 2022-10-07 ENCOUNTER — Encounter (HOSPITAL_COMMUNITY): Payer: Self-pay | Admitting: Emergency Medicine

## 2022-10-07 ENCOUNTER — Other Ambulatory Visit: Payer: Self-pay

## 2022-10-07 DIAGNOSIS — T40715A Adverse effect of cannabis, initial encounter: Secondary | ICD-10-CM | POA: Diagnosis not present

## 2022-10-07 DIAGNOSIS — T50905A Adverse effect of unspecified drugs, medicaments and biological substances, initial encounter: Secondary | ICD-10-CM

## 2022-10-07 DIAGNOSIS — R42 Dizziness and giddiness: Secondary | ICD-10-CM | POA: Insufficient documentation

## 2022-10-07 DIAGNOSIS — R112 Nausea with vomiting, unspecified: Secondary | ICD-10-CM | POA: Insufficient documentation

## 2022-10-07 LAB — CBC WITH DIFFERENTIAL/PLATELET
Abs Immature Granulocytes: 0.02 10*3/uL (ref 0.00–0.07)
Basophils Absolute: 0 10*3/uL (ref 0.0–0.1)
Basophils Relative: 1 %
Eosinophils Absolute: 0 10*3/uL (ref 0.0–0.5)
Eosinophils Relative: 0 %
HCT: 40.9 % (ref 36.0–46.0)
Hemoglobin: 13.2 g/dL (ref 12.0–15.0)
Immature Granulocytes: 0 %
Lymphocytes Relative: 43 %
Lymphs Abs: 3.1 10*3/uL (ref 0.7–4.0)
MCH: 27.4 pg (ref 26.0–34.0)
MCHC: 32.3 g/dL (ref 30.0–36.0)
MCV: 85 fL (ref 80.0–100.0)
Monocytes Absolute: 0.6 10*3/uL (ref 0.1–1.0)
Monocytes Relative: 8 %
Neutro Abs: 3.5 10*3/uL (ref 1.7–7.7)
Neutrophils Relative %: 48 %
Platelets: 390 10*3/uL (ref 150–400)
RBC: 4.81 MIL/uL (ref 3.87–5.11)
RDW: 14.5 % (ref 11.5–15.5)
WBC: 7.2 10*3/uL (ref 4.0–10.5)
nRBC: 0 % (ref 0.0–0.2)

## 2022-10-07 LAB — COMPREHENSIVE METABOLIC PANEL
ALT: 14 U/L (ref 0–44)
AST: 16 U/L (ref 15–41)
Albumin: 3.4 g/dL — ABNORMAL LOW (ref 3.5–5.0)
Alkaline Phosphatase: 54 U/L (ref 38–126)
Anion gap: 7 (ref 5–15)
BUN: 10 mg/dL (ref 6–20)
CO2: 27 mmol/L (ref 22–32)
Calcium: 9 mg/dL (ref 8.9–10.3)
Chloride: 103 mmol/L (ref 98–111)
Creatinine, Ser: 0.88 mg/dL (ref 0.44–1.00)
GFR, Estimated: >60 mL/min (ref >60)
Glucose, Bld: 136 mg/dL — ABNORMAL HIGH (ref 70–99)
Potassium: 4.7 mmol/L (ref 3.5–5.1)
Sodium: 137 mmol/L (ref 135–145)
Total Bilirubin: 0.5 mg/dL (ref 0.3–1.2)
Total Protein: 7.1 g/dL (ref 6.5–8.1)

## 2022-10-07 LAB — I-STAT BETA HCG BLOOD, ED (MC, WL, AP ONLY): I-stat hCG, quantitative: <5 m[IU]/mL (ref <5)

## 2022-10-07 MED ORDER — METOCLOPRAMIDE HCL 5 MG/ML IJ SOLN
10.0000 mg | Freq: Once | INTRAMUSCULAR | Status: AC
  Administered 2022-10-07: 10 mg via INTRAVENOUS
  Filled 2022-10-07: qty 2

## 2022-10-07 MED ORDER — SODIUM CHLORIDE 0.9 % IV BOLUS
1000.0000 mL | Freq: Once | INTRAVENOUS | Status: AC
  Administered 2022-10-07: 1000 mL via INTRAVENOUS

## 2022-10-07 NOTE — ED Triage Notes (Signed)
Pt BIB EMS from home with c/o dizzy, palpitations, and N/V after taking a half of a delta 8 gummy at 1900.  Cbg 160  '4mg'$  Zofran IM

## 2022-10-07 NOTE — ED Provider Notes (Signed)
[facility] Provider Note   CSN: [id number] Arrival date & time: 10/07/22  [id number]     History  Chief Complaint  Patient presents with   Emesis    Abigail Briggs is a 37 y.o. female.  Patient presents to the emergency department for evaluation of dizziness, nausea and vomiting.  Symptoms ongoing since 7 PM at which point she ate an unknown strength delta 8 gummy.       Home Medications Prior to Admission medications   Medication Sig Start Date End Date Taking? Authorizing Provider  brompheniramine-pseudoephedrine-DM 30-2-10 MG/5ML syrup Take 10 mLs by mouth 4 (four) times daily as needed. 12/18/20   Cuthriell, Charline Bills, PA-C  Diclofenac Potassium 50 MG PACK Take 50 mg by mouth once as needed. Take once daily as needed with headache onset. Please take with food Patient not taking: Reported on 09/28/2018 09/12/15   Melvenia Beam, MD  docusate sodium (COLACE) 100 MG capsule Take 1 capsule (100 mg total) by mouth 2 (two) times daily as needed for mild constipation. Patient not taking: Reported on 10/26/2018 09/09/18   Anyanwu, Sallyanne Havers, MD  escitalopram (LEXAPRO) 10 MG tablet Take 10 mg by mouth daily.    [provider]  etonogestrel (NEXPLANON) 68 MG IMPL implant 1 each by Subdermal route once.    [provider]  fluticasone (FLONASE) 50 MCG/ACT nasal spray Place 1 spray into both nostrils 2 (two) times daily. 12/18/20   Cuthriell, Charline Bills, PA-C  ibuprofen (ADVIL,MOTRIN) 800 MG tablet Take 1 tablet (800 mg total) by mouth 3 (three) times daily with meals as needed for fever, headache, moderate pain or cramping. Patient not taking: Reported on 10/26/2018 09/09/18   Anyanwu, Sallyanne Havers, MD  lidocaine (XYLOCAINE) 2 % solution Use as directed 10 mLs in the mouth or throat every 4 (four) hours as needed (sore throat). 12/18/20   Cuthriell, Charline Bills, PA-C  naproxen (NAPROSYN) 375 MG tablet Take 375 mg by mouth 2 (two) times daily  with a meal.    [provider]  oxyCODONE (OXY IR/ROXICODONE) 5 MG immediate release tablet Take 1 tablet (5 mg total) by mouth every 4 (four) hours as needed for severe pain or breakthrough pain. Patient not taking: Reported on 10/26/2018 09/09/18   Osborne Oman, MD  Prenatal Vit-Fe Fumarate-FA (PRENATAL MULTIVITAMIN) TABS tablet Take 1 tablet by mouth daily at 12 noon.    [provider]  simethicone (MYLICON) 80 MG chewable tablet Chew 1 tablet (80 mg total) by mouth 4 (four) times daily as needed (gas pain). Patient not taking: Reported on 10/26/2018 09/09/18   Anyanwu, Sallyanne Havers, MD  SUMAtriptan (IMITREX) 100 MG tablet Take 1 tablet (100 mg total) by mouth once as needed for migraine. May repeat in 2 hours if headache persists or recurs. Patient not taking: Reported on 09/28/2018 09/12/15   Melvenia Beam, MD      Allergies    Patient has no known allergies.    Review of Systems   Review of Systems  Physical Exam Updated Vital Signs BP 112/79   Pulse 74   Temp 98.2 F (36.8 C) (Oral)   Resp 18   Ht '5\' 4"'$  (1.626 m)   Wt 97 kg   LMP 07/03/2015   SpO2 96%   BMI 36.71 kg/m  Physical Exam Vitals and nursing note reviewed.  Constitutional:      General: She is not in acute distress.  Appearance: She is well-developed.  HENT:     Head: Normocephalic and atraumatic.     Mouth/Throat:     Mouth: Mucous membranes are moist.  Eyes:     General: Vision grossly intact. Gaze aligned appropriately.     Extraocular Movements: Extraocular movements intact.     Conjunctiva/sclera: Conjunctivae normal.  Cardiovascular:     Rate and Rhythm: Normal rate and regular rhythm.     Pulses: Normal pulses.     Heart sounds: Normal heart sounds, S1 normal and S2 normal. No murmur heard.    No friction rub. No gallop.  Pulmonary:     Effort: Pulmonary effort is normal. No respiratory distress.     Breath sounds: Normal breath sounds.  Abdominal:     General: Bowel sounds  are normal.     Palpations: Abdomen is soft.     Tenderness: There is no abdominal tenderness. There is no guarding or rebound.     Hernia: No hernia is present.  Musculoskeletal:        General: No swelling.     Cervical back: Full passive range of motion without pain, normal range of motion and neck supple. No spinous process tenderness or muscular tenderness. Normal range of motion.     Right lower leg: No edema.     Left lower leg: No edema.  Skin:    General: Skin is warm and dry.     Capillary Refill: Capillary refill takes less than 2 seconds.     Findings: No ecchymosis, erythema, rash or wound.  Neurological:     General: No focal deficit present.     Mental Status: She is alert and oriented to person, place, and time.     GCS: GCS eye subscore is 4. GCS verbal subscore is 5. GCS motor subscore is 6.     Cranial Nerves: Cranial nerves 2-12 are intact.     Sensory: Sensation is intact.     Motor: Motor function is intact.     Coordination: Coordination is intact.  Psychiatric:        Attention and Perception: Attention normal.        Mood and Affect: Mood normal.        Speech: Speech normal.        Behavior: Behavior normal.     ED Results / Procedures / Treatments   Labs (all labs ordered are listed, but only abnormal results are displayed) Labs Reviewed  COMPREHENSIVE METABOLIC PANEL - Abnormal; Notable for the following components:      Result Value   Glucose, Bld 136 (*)    Albumin 3.4 (*)    All other components within normal limits  CBC WITH DIFFERENTIAL/PLATELET  I-STAT BETA HCG BLOOD, ED ([facility], [facility], [facility] ONLY)    EKG None  Radiology No results found.  Procedures Procedures    Medications Ordered in ED Medications  sodium chloride 0.9 % bolus 1,000 mL (0 mLs Intravenous Stopped 10/07/22 0246)  metoCLOPramide (REGLAN) injection 10 mg (10 mg Intravenous Given 10/07/22 0140)    ED Course/ Medical Decision Making/ A&P                              Medical Decision Making Amount and/or Complexity of Data Reviewed Labs: ordered.  Risk Prescription drug management.   Presents with acute dizziness, nausea and vomiting after eating a delta 8 gummy.  Patient has a nonfocal exam.  Abdominal exam  benign.  No focal neurologic deficits.  Symptoms very consistent with intoxication by delta 8.  Patient workup unremarkable.  Vital signs unremarkable.  She has improved with IV fluids and antiemetics.  No further workup necessary.        Final Clinical Impression(s) / ED Diagnoses Final diagnoses:  Adverse drug effect, initial encounter    Rx / DC Orders ED Discharge Orders     None         Bertrum Helmstetter, Gwenyth Allegra, MD 10/07/22 [id number]

## 2023-02-17 ENCOUNTER — Emergency Department (HOSPITAL_BASED_OUTPATIENT_CLINIC_OR_DEPARTMENT_OTHER)
Admission: EM | Admit: 2023-02-17 | Discharge: 2023-02-17 | Disposition: A | Discharge status: Home / Self Care | Payer: Medicaid Other | Attending: Emergency Medicine | Admitting: Emergency Medicine

## 2023-02-17 ENCOUNTER — Other Ambulatory Visit: Payer: Self-pay

## 2023-02-17 DIAGNOSIS — R202 Paresthesia of skin: Secondary | ICD-10-CM | POA: Insufficient documentation

## 2023-02-17 DIAGNOSIS — R42 Dizziness and giddiness: Secondary | ICD-10-CM

## 2023-02-17 DIAGNOSIS — R110 Nausea: Secondary | ICD-10-CM | POA: Insufficient documentation

## 2023-02-17 LAB — CBC WITH DIFFERENTIAL/PLATELET
Abs Immature Granulocytes: 0.02 10*3/uL (ref 0.00–0.07)
Basophils Absolute: 0.1 10*3/uL (ref 0.0–0.1)
Basophils Relative: 1 %
Eosinophils Absolute: 0.1 10*3/uL (ref 0.0–0.5)
Eosinophils Relative: 1 %
HCT: 43.1 % (ref 36.0–46.0)
Hemoglobin: 13.8 g/dL (ref 12.0–15.0)
Immature Granulocytes: 0 %
Lymphocytes Relative: 41 %
Lymphs Abs: 3.3 10*3/uL (ref 0.7–4.0)
MCH: 27.2 pg (ref 26.0–34.0)
MCHC: 32 g/dL (ref 30.0–36.0)
MCV: 85 fL (ref 80.0–100.0)
Monocytes Absolute: 0.6 10*3/uL (ref 0.1–1.0)
Monocytes Relative: 8 %
Neutro Abs: 3.9 10*3/uL (ref 1.7–7.7)
Neutrophils Relative %: 49 %
Platelets: 289 10*3/uL (ref 150–400)
RBC: 5.07 MIL/uL (ref 3.87–5.11)
RDW: 14.2 % (ref 11.5–15.5)
WBC: 8 10*3/uL (ref 4.0–10.5)
nRBC: 0 % (ref 0.0–0.2)

## 2023-02-17 LAB — COMPREHENSIVE METABOLIC PANEL
ALT: 13 U/L (ref 0–44)
AST: 12 U/L — ABNORMAL LOW (ref 15–41)
Albumin: 4 g/dL (ref 3.5–5.0)
Alkaline Phosphatase: 55 U/L (ref 38–126)
Anion gap: 9 (ref 5–15)
BUN: 10 mg/dL (ref 6–20)
CO2: 25 mmol/L (ref 22–32)
Calcium: 8.9 mg/dL (ref 8.9–10.3)
Chloride: 103 mmol/L (ref 98–111)
Creatinine, Ser: 0.93 mg/dL (ref 0.44–1.00)
GFR, Estimated: >60 mL/min (ref >60)
Glucose, Bld: 95 mg/dL (ref 70–99)
Potassium: 3.6 mmol/L (ref 3.5–5.1)
Sodium: 137 mmol/L (ref 135–145)
Total Bilirubin: 0.3 mg/dL (ref 0.3–1.2)
Total Protein: 7.8 g/dL (ref 6.5–8.1)

## 2023-02-17 LAB — URINALYSIS, ROUTINE W REFLEX MICROSCOPIC
Bilirubin Urine: NEGATIVE
Glucose, UA: NEGATIVE mg/dL
Hgb urine dipstick: NEGATIVE
Ketones, ur: NEGATIVE mg/dL
Leukocytes,Ua: NEGATIVE
Nitrite: NEGATIVE
Protein, ur: NEGATIVE mg/dL
Specific Gravity, Urine: 1.015 (ref 1.005–1.030)
pH: 6.5 (ref 5.0–8.0)

## 2023-02-17 LAB — TROPONIN I (HIGH SENSITIVITY): Troponin I (High Sensitivity): 2 ng/L (ref <18)

## 2023-02-17 LAB — PREGNANCY, URINE: Preg Test, Ur: NEGATIVE

## 2023-02-17 NOTE — ED Notes (Signed)
Discharge paperwork reviewed entirely with patient, including follow up care. Pain was under control. The patient received instruction and coaching on their prescriptions, and all follow-up questions were answered.  Pt verbalized understanding as well as all parties involved. No questions or concerns voiced at the time of discharge. No acute distress noted.   Pt ambulated out to PVA without incident or assistance.  

## 2023-02-17 NOTE — ED Triage Notes (Signed)
Patient presents to ED via POV from work. Here with dizziness and nausea. Symptoms began today. Patient also reports intermittent left arm tingling.

## 2023-02-17 NOTE — ED Provider Notes (Signed)
[facility] Provider Note   CSN: [id number] Arrival date & time: 02/17/23  1521     History  Chief Complaint  Patient presents with   Dizziness    Abigail Briggs is a 37 y.o. female.  The history is provided by the patient.   Patient with history of obesity presents with dizziness, nausea and left arm tingling.  Patient reports several days ago she felt unwell but no specific pain. 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  Earlier today she was at work when she started having lightheadedness and dizziness, and her left arm is tingling.  She reports warmth on her chest.  No fevers or vomiting.  No headaches.  No focal weakness.  She reports her heart rate was elevated up to 115 during this episode. No syncope is reported.  She has no known cardiac history      Home Medications Prior to Admission medications   Medication Sig Start Date End Date Taking? Authorizing Provider  escitalopram (LEXAPRO) 10 MG tablet Take 10 mg by mouth daily.    [provider]  etonogestrel (NEXPLANON) 68 MG IMPL implant 1 each by Subdermal route once.    [provider]  fluticasone (FLONASE) 50 MCG/ACT nasal spray Place 1 spray into both nostrils 2 (two) times daily. 12/18/20   Cuthriell, Delorise Royals, PA-C  Prenatal Vit-Fe Fumarate-FA (PRENATAL MULTIVITAMIN) TABS tablet Take 1 tablet by mouth daily at 12 noon.    [provider]      Allergies    Patient has no known allergies.    Review of Systems   Review of Systems  Constitutional:  Negative for fever.  Respiratory:  Negative for shortness of breath.   Neurological:  Negative for syncope.    Physical Exam Updated Vital Signs BP 118/83   Pulse 74   Temp 98.9 F (37.2 C) (Oral)   Resp 17   Ht 1.6 m (5\' 3" )   Wt 108.9 kg   LMP 07/03/2015   SpO2 100%   BMI 42.51 kg/m  Physical Exam CONSTITUTIONAL: Well developed/well  nourished HEAD: Normocephalic/atraumatic EYES: EOMI/PERRL, no nystagmus, no ptosis ENMT: Mucous membranes moist NECK: supple no meningeal signs, no bruits SPINE/BACK:entire spine nontender CV: S1/S2 noted, no murmurs/rubs/gallops noted LUNGS: Lungs are clear to auscultation bilaterally, no apparent distress ABDOMEN: soft, nontender NEURO:Awake/alert, face symmetric, no arm or leg drift is noted Equal 5/5 strength with shoulder abduction, elbow flex/extension, wrist flex/extension in upper extremities and equal hand grips bilaterally Equal 5/5 strength with hip flexion,knee flex/extension, foot dorsi/plantar flexion Cranial nerves 3/4/5/6/02/03/09/11/12 tested and intact No past pointing Sensation to light touch intact in all extremities EXTREMITIES: pulses normal, full ROM SKIN: warm, color normal PSYCH: no abnormalities of mood noted, alert and oriented to situation  ED Results / Procedures / Treatments   Labs (all labs ordered are listed, but only abnormal results are displayed) Labs Reviewed  COMPREHENSIVE METABOLIC PANEL - Abnormal; Notable for the following components:      Result Value   AST 12 (*)    All other components within normal limits  CBC WITH DIFFERENTIAL/PLATELET  URINALYSIS, ROUTINE W REFLEX MICROSCOPIC  PREGNANCY, URINE  TROPONIN I (HIGH SENSITIVITY)    EKG EKG Interpretation Date/Time:  Monday February 17 2023 15:48:23 EDT Ventricular Rate:  76 PR Interval:  150 QRS Duration:  74 QT Interval:  358 QTC Calculation: 402 R Axis:   91  Text  Interpretation: Normal sinus rhythm Rightward axis Borderline ECG Confirmed by Zadie Rhine ([id number]) on 02/17/2023 4:50:26 PM  Radiology No results found.  Procedures Procedures    Medications Ordered in ED Medications - No data to display  ED Course/ Medical Decision Making/ A&P Clinical Course as of 02/17/23 1930  Mon Feb 17, 2023  [id number] Patient presented with nonspecific dizziness and left arm tingling. She  is now back to baseline. She walked in the ER without difficulty  Patient reports at that time her heart rate was elevated around 115. It is possible she may need follow-up as an outpatient for palpitations of her heart monitor.  At this point she is improved, no signs of acute cardiovascular or neurologic emergency. We discussed strict return precautions [DW]    Clinical Course User Index [DW] Zadie Rhine, MD                             Medical Decision Making Amount and/or Complexity of Data Reviewed Labs: ordered.   This patient presents to the ED for concern of dizziness, this involves an extensive number of treatment options, and is a complaint that carries with it a high risk of complications and morbidity.  The differential diagnosis includes but is not limited to CVA, intracranial hemorrhage, acute coronary syndrome, renal failure, urinary tract infection, electrolyte disturbance, pneumonia   Comorbidities that complicate the patient evaluation: Patient's presentation is complicated by their history of obesity  Social Determinants of Health: Patient's  increased work hours   increases the complexity of managing their presentation  Additional history obtained: Additional history obtained from spouse Records reviewed Primary Care Documents  Lab Tests: I Ordered, and personally interpreted labs.  The pertinent results include: Labs unremarkable  Test Considered: I considered further imaging, but patient is already feeling improved, no further workup indicated   Reevaluation: After the interventions noted above, I reevaluated the patient and found that they have :improved  Complexity of problems addressed: Patient's presentation is most consistent with  acute presentation with potential threat to life or bodily function  Disposition: After consideration of the diagnostic results and the patient's response to treatment,  I feel that the patent would benefit from  discharge   .           Final Clinical Impression(s) / ED Diagnoses Final diagnoses:  Lightheadedness    Rx / DC Orders ED Discharge Orders     None         Zadie Rhine, MD 02/17/23 1930

## 2023-02-17 NOTE — Discharge Instructions (Signed)
Your caregiver has seen you today because you are having problems with feelings of weakness, dizziness, and/or fatigue. Weakness has many different causes, some of which are common and others are very rare. Your caregiver has considered some of the most common causes of weakness and feels it is safe for you to go home and be observed. Not every illness or injury can be identified during an emergency department visit, thus follow-up with your primary healthcare provider is important. Medical conditions can also worsen, so it is also important to return immediately as directed below, or if you have other serious concerns develop. ° °RETURN IMMEDIATELY IF  °you develop new shortness of breath, chest pain, fever, have difficulty moving parts of your body (new weakness, numbness, or incoordination), sudden change in speech, vision, swallowing, or understanding, faint or develop new dizziness, severe headache, become poorly responsive or have an altered mental status compared to baseline for you, new rash, abdominal pain, or bloody stools,  °Return sooner also if you develop new problems for which you have not talked to your caregiver but you feel may be emergency medical conditions. ° °

## 2023-09-20 ENCOUNTER — Other Ambulatory Visit: Payer: Self-pay

## 2023-09-20 ENCOUNTER — Encounter (HOSPITAL_BASED_OUTPATIENT_CLINIC_OR_DEPARTMENT_OTHER): Payer: Self-pay | Admitting: Emergency Medicine

## 2023-09-20 ENCOUNTER — Emergency Department (HOSPITAL_BASED_OUTPATIENT_CLINIC_OR_DEPARTMENT_OTHER)
Admission: EM | Admit: 2023-09-20 | Discharge: 2023-09-20 | Disposition: A | Discharge status: Home / Self Care | Payer: Medicaid Other | Attending: Emergency Medicine | Admitting: Emergency Medicine

## 2023-09-20 DIAGNOSIS — Z8673 Personal history of transient ischemic attack (TIA), and cerebral infarction without residual deficits: Secondary | ICD-10-CM | POA: Insufficient documentation

## 2023-09-20 DIAGNOSIS — R051 Acute cough: Secondary | ICD-10-CM

## 2023-09-20 DIAGNOSIS — R509 Fever, unspecified: Secondary | ICD-10-CM | POA: Diagnosis present

## 2023-09-20 DIAGNOSIS — M791 Myalgia, unspecified site: Secondary | ICD-10-CM

## 2023-09-20 DIAGNOSIS — J101 Influenza due to other identified influenza virus with other respiratory manifestations: Secondary | ICD-10-CM | POA: Diagnosis not present

## 2023-09-20 LAB — RESP PANEL BY RT-PCR (RSV, FLU A&B, COVID)  RVPGX2
Influenza A by PCR: POSITIVE — AB
Influenza B by PCR: NEGATIVE
Resp Syncytial Virus by PCR: NEGATIVE
SARS Coronavirus 2 by RT PCR: NEGATIVE

## 2023-09-20 MED ORDER — CETIRIZINE-PSEUDOEPHEDRINE ER 5-120 MG PO TB12: 1.0000 | ORAL_TABLET | Freq: Every day | ORAL | 0 refills | Status: AC | PRN

## 2023-09-20 MED ORDER — IBUPROFEN 600 MG PO TABS: 600.0000 mg | ORAL_TABLET | Freq: Four times a day (QID) | ORAL | 0 refills | Status: AC | PRN

## 2023-09-20 MED ORDER — BENZONATATE 100 MG PO CAPS: 100.0000 mg | ORAL_CAPSULE | Freq: Three times a day (TID) | ORAL | 0 refills | Status: AC | PRN

## 2023-09-20 NOTE — ED Provider Notes (Signed)
 [facility] Provider Note   CSN: [id number] Arrival date & time: 09/20/23  1914     History  Chief Complaint  Patient presents with   URI    Abigail Briggs is a 38 y.o. female.   URI   39 year old female presents emergency department with complaints of cough, fever, body aches.  States that symptoms began on Thursday around 3 days ago.  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  Has been taking DayQuil/NyQuil with some improvement in symptoms.  Presents emergency department due to continued symptoms.  Last documented fever this morning of 101.2 of which she took Tylenol for.  Denies any chest pain, shortness of breath, abdominal pain, nausea, vomiting.  Past medical history significant for TIA, migraine, postoperative anemia  Home Medications Prior to Admission medications   Medication Sig Start Date End Date Taking? Authorizing Provider  benzonatate (TESSALON) 100 MG capsule Take 1 capsule (100 mg total) by mouth 3 (three) times daily as needed. 09/20/23  Yes Sherian Maroon A, PA  cetirizine-pseudoephedrine (ZYRTEC-D) 5-120 MG tablet Take 1 tablet by mouth daily as needed for allergies or rhinitis. 09/20/23  Yes Sherian Maroon A, PA  ibuprofen (ADVIL) 600 MG tablet Take 1 tablet (600 mg total) by mouth every 6 (six) hours as needed. 09/20/23  Yes Sherian Maroon A, PA  escitalopram (LEXAPRO) 10 MG tablet Take 10 mg by mouth daily.    [provider]  etonogestrel (NEXPLANON) 68 MG IMPL implant 1 each by Subdermal route once.    [provider]  fluticasone (FLONASE) 50 MCG/ACT nasal spray Place 1 spray into both nostrils 2 (two) times daily. 12/18/20   Cuthriell, Delorise Royals, PA-C  Prenatal Vit-Fe Fumarate-FA (PRENATAL MULTIVITAMIN) TABS tablet Take 1 tablet by mouth daily at 12 noon.    [provider]      Allergies    Patient has no known allergies.    Review of  Systems   Review of Systems  All other systems reviewed and are negative.   Physical Exam Updated Vital Signs BP 127/83 (BP Location: Left Arm)   Pulse 96   Temp 99.1 F (37.3 C) (Oral)   Resp 20   Ht 5\' 3"  (1.6 m)   Wt 106.6 kg   LMP 07/03/2015   SpO2 98%   BMI 41.63 kg/m  Physical Exam Vitals and nursing note reviewed.  Constitutional:      General: She is not in acute distress.    Appearance: She is well-developed.  HENT:     Head: Normocephalic and atraumatic.     Nose: Congestion present.     Mouth/Throat:     Pharynx: No oropharyngeal exudate or posterior oropharyngeal erythema.  Eyes:     Conjunctiva/sclera: Conjunctivae normal.  Cardiovascular:     Rate and Rhythm: Normal rate and regular rhythm.     Heart sounds: No murmur heard. Pulmonary:     Effort: Pulmonary effort is normal. No respiratory distress.     Breath sounds: Normal breath sounds. No wheezing, rhonchi or rales.  Abdominal:     Palpations: Abdomen is soft.     Tenderness: There is no abdominal tenderness. There is no guarding.  Musculoskeletal:        General: No swelling.     Cervical back: Neck supple.     Right lower leg: No edema.     Left lower leg: No edema.  Skin:    General:  Skin is warm and dry.     Capillary Refill: Capillary refill takes less than 2 seconds.  Neurological:     Mental Status: She is alert.  Psychiatric:        Mood and Affect: Mood normal.     ED Results / Procedures / Treatments   Labs (all labs ordered are listed, but only abnormal results are displayed) Labs Reviewed  RESP PANEL BY RT-PCR (RSV, FLU A&B, COVID)  RVPGX2 - Abnormal; Notable for the following components:      Result Value   Influenza A by PCR POSITIVE (*)    All other components within normal limits    EKG None  Radiology No results found.  Procedures Procedures    Medications Ordered in ED Medications - No data to display  ED Course/ Medical Decision Making/ A&P                                  Medical Decision Making Risk OTC drugs. Prescription drug management.   This patient presents to the ED for concern of cough, nasal congestion, this involves an extensive number of treatment options, and is a complaint that carries with it a high risk of complications and morbidity.  The differential diagnosis includes COVID, flu, RSV, pneumonia, asthma, GERD, asthma, COPD, other   Co morbidities that complicate the patient evaluation  See HPI   Additional history obtained:  Additional history obtained from EMR External records from outside source obtained and reviewed including hospital records   Lab Tests:  I Ordered, and personally interpreted labs.  The pertinent results include: Viral testing positive for influenza A   Imaging Studies ordered:  N/a   Cardiac Monitoring: / EKG:  The patient was maintained on a cardiac monitor.  I personally viewed and interpreted the cardiac monitored which showed an underlying rhythm of: No acute cardiopulmonary abnormality   Consultations Obtained:  N/a   Problem List / ED Course / Critical interventions / Medication management  Cough, nasal congestion I ordered medication including Zofran   Reevaluation of the patient after these medicines showed that the patient improved I have reviewed the patients home medicines and have made adjustments as needed   Social Determinants of Health:  Denies tobacco, illicit drug use.   Test / Admission - Considered:  Influenza A, cough, nasal congestion Vitals signs within normal range and stable throughout visit. Laboratory/imaging studies significant for: See above 38 year old female presents emergency department complaints of cough, body aches, fever, nasal congestion.  Symptom onset 3 days ago.  On exam, lungs clear to auscultation; low suspicion for pneumonia.  Viral testing was performed which patient tested positive for influenza A which is most likely  causing symptoms.  Patient outside of window for beginning Tamiflu.  Will recommend symptomatic therapy as described in AVS and close follow-up with PCP in the outpatient setting.  Treatment plan discussed at length with patient and she acknowledged understanding was agreeable to said plan.  Patient overall well-appearing, afebrile in no acute distress. Worrisome signs and symptoms were discussed with the patient, and the patient acknowledged understanding to return to the ED if noticed. Patient was stable upon discharge.         Final Clinical Impression(s) / ED Diagnoses Final diagnoses:  Influenza A  Acute cough  Myalgia    Rx / DC Orders ED Discharge Orders          Ordered  benzonatate (TESSALON) 100 MG capsule  3 times daily PRN        09/20/23 1027    cetirizine-pseudoephedrine (ZYRTEC-D) 5-120 MG tablet  Daily PRN        09/20/23 1027    ibuprofen (ADVIL) 600 MG tablet  Every 6 hours PRN        09/20/23 1027              Peter Garter, [place] 09/20/23 1037    Alvira Monday, MD 09/21/23 [phone]

## 2023-09-20 NOTE — ED Triage Notes (Signed)
 Flu like symptoms since Thursday.  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  Highest fever at home 101.2.  eating and drinking ok.  No diarrhea.  Pt took dayquil this am.

## 2023-09-20 NOTE — Discharge Instructions (Signed)
 As discussed, you did test positive for the flu which is most likely causing symptoms.  Will send in cough medicine, allergy medicine with decongestion as well as Motrin for treatment of your symptoms.  Recommend follow-up with your primary care for reassessment.  Please do not hesitate to return to emergency department if the worrisome signs and symptoms we discussed to become apparent.
